# Patient Record
Sex: Female | Born: 1960 | Race: White | Hispanic: No | Marital: Married | Smoking: Former smoker
Health system: Southern US, Community
[De-identification: ages and names within clinical notes are randomized; demographics above are authoritative.]

## PROBLEM LIST (undated history)

## (undated) DIAGNOSIS — B009 Herpesviral infection, unspecified: Secondary | ICD-10-CM

## (undated) DIAGNOSIS — M7070 Other bursitis of hip, unspecified hip: Secondary | ICD-10-CM

## (undated) DIAGNOSIS — M419 Scoliosis, unspecified: Secondary | ICD-10-CM

## (undated) DIAGNOSIS — IMO0002 Reserved for concepts with insufficient information to code with codable children: Secondary | ICD-10-CM

## (undated) DIAGNOSIS — B2790 Infectious mononucleosis, unspecified without complication: Secondary | ICD-10-CM

## (undated) DIAGNOSIS — M25552 Pain in left hip: Secondary | ICD-10-CM

## (undated) DIAGNOSIS — S42309A Unspecified fracture of shaft of humerus, unspecified arm, initial encounter for closed fracture: Secondary | ICD-10-CM

## (undated) DIAGNOSIS — T7840XA Allergy, unspecified, initial encounter: Secondary | ICD-10-CM

## (undated) DIAGNOSIS — D179 Benign lipomatous neoplasm, unspecified: Secondary | ICD-10-CM

## (undated) DIAGNOSIS — D1720 Benign lipomatous neoplasm of skin and subcutaneous tissue of unspecified limb: Secondary | ICD-10-CM

## (undated) DIAGNOSIS — R87619 Unspecified abnormal cytological findings in specimens from cervix uteri: Secondary | ICD-10-CM

## (undated) DIAGNOSIS — N939 Abnormal uterine and vaginal bleeding, unspecified: Secondary | ICD-10-CM

## (undated) DIAGNOSIS — M21619 Bunion of unspecified foot: Secondary | ICD-10-CM

## (undated) DIAGNOSIS — M1990 Unspecified osteoarthritis, unspecified site: Secondary | ICD-10-CM

## (undated) DIAGNOSIS — K1379 Other lesions of oral mucosa: Secondary | ICD-10-CM

## (undated) DIAGNOSIS — F419 Anxiety disorder, unspecified: Secondary | ICD-10-CM

## (undated) DIAGNOSIS — T148XXA Other injury of unspecified body region, initial encounter: Secondary | ICD-10-CM

## (undated) DIAGNOSIS — Z8489 Family history of other specified conditions: Secondary | ICD-10-CM

## (undated) DIAGNOSIS — Z0000 Encounter for general adult medical examination without abnormal findings: Secondary | ICD-10-CM

## (undated) HISTORY — DX: Pain in left hip: M25.552

## (undated) HISTORY — DX: Abnormal uterine and vaginal bleeding, unspecified: N93.9

## (undated) HISTORY — DX: Anxiety disorder, unspecified: F41.9

## (undated) HISTORY — DX: Other bursitis of hip, unspecified hip: M70.70

## (undated) HISTORY — DX: Unspecified osteoarthritis, unspecified site: M19.90

## (undated) HISTORY — DX: Benign lipomatous neoplasm of skin and subcutaneous tissue of unspecified limb: D17.20

## (undated) HISTORY — DX: Herpesviral infection, unspecified: B00.9

## (undated) HISTORY — PX: APPENDECTOMY: SHX54

## (undated) HISTORY — DX: Unspecified fracture of shaft of humerus, unspecified arm, initial encounter for closed fracture: S42.309A

## (undated) HISTORY — DX: Unspecified abnormal cytological findings in specimens from cervix uteri: R87.619

## (undated) HISTORY — DX: Reserved for concepts with insufficient information to code with codable children: IMO0002

## (undated) HISTORY — DX: Other lesions of oral mucosa: K13.79

## (undated) HISTORY — DX: Bunion of unspecified foot: M21.619

## (undated) HISTORY — DX: Benign lipomatous neoplasm, unspecified: D17.9

## (undated) HISTORY — DX: Allergy, unspecified, initial encounter: T78.40XA

## (undated) HISTORY — DX: Encounter for general adult medical examination without abnormal findings: Z00.00

## (undated) HISTORY — DX: Infectious mononucleosis, unspecified without complication: B27.90

---

## 1994-07-13 DIAGNOSIS — R87619 Unspecified abnormal cytological findings in specimens from cervix uteri: Secondary | ICD-10-CM

## 1994-07-13 HISTORY — DX: Unspecified abnormal cytological findings in specimens from cervix uteri: R87.619

## 1994-07-13 HISTORY — PX: CERVICAL BIOPSY  W/ LOOP ELECTRODE EXCISION: SUR135

## 1997-09-24 HISTORY — PX: INTRAUTERINE DEVICE INSERTION: SHX323

## 2002-07-13 DIAGNOSIS — S42309A Unspecified fracture of shaft of humerus, unspecified arm, initial encounter for closed fracture: Secondary | ICD-10-CM

## 2002-07-13 HISTORY — DX: Unspecified fracture of shaft of humerus, unspecified arm, initial encounter for closed fracture: S42.309A

## 2003-02-04 ENCOUNTER — Emergency Department (HOSPITAL_COMMUNITY): Admission: EM | Admit: 2003-02-04 | Discharge: 2003-02-04 | Payer: Self-pay | Admitting: Emergency Medicine

## 2003-02-04 ENCOUNTER — Encounter: Payer: Self-pay | Admitting: Emergency Medicine

## 2004-06-27 ENCOUNTER — Other Ambulatory Visit: Admission: RE | Admit: 2004-06-27 | Discharge: 2004-06-27 | Payer: Self-pay | Admitting: Obstetrics and Gynecology

## 2004-07-18 ENCOUNTER — Ambulatory Visit (HOSPITAL_COMMUNITY): Admission: RE | Admit: 2004-07-18 | Discharge: 2004-07-18 | Payer: Self-pay | Admitting: Obstetrics and Gynecology

## 2005-06-29 ENCOUNTER — Other Ambulatory Visit: Admission: RE | Admit: 2005-06-29 | Discharge: 2005-06-29 | Payer: Self-pay | Admitting: Obstetrics and Gynecology

## 2005-07-21 ENCOUNTER — Ambulatory Visit (HOSPITAL_COMMUNITY): Admission: RE | Admit: 2005-07-21 | Discharge: 2005-07-21 | Payer: Self-pay | Admitting: Obstetrics and Gynecology

## 2005-07-21 ENCOUNTER — Encounter: Admission: RE | Admit: 2005-07-21 | Discharge: 2005-07-21 | Payer: Self-pay | Admitting: Obstetrics and Gynecology

## 2006-06-30 ENCOUNTER — Other Ambulatory Visit: Admission: RE | Admit: 2006-06-30 | Discharge: 2006-06-30 | Payer: Self-pay | Admitting: Obstetrics & Gynecology

## 2006-07-23 ENCOUNTER — Encounter: Admission: RE | Admit: 2006-07-23 | Discharge: 2006-07-23 | Payer: Self-pay | Admitting: Obstetrics & Gynecology

## 2007-08-23 ENCOUNTER — Other Ambulatory Visit: Admission: RE | Admit: 2007-08-23 | Discharge: 2007-08-23 | Payer: Self-pay | Admitting: Obstetrics & Gynecology

## 2007-09-09 ENCOUNTER — Encounter: Admission: RE | Admit: 2007-09-09 | Discharge: 2007-09-09 | Payer: Self-pay | Admitting: Obstetrics & Gynecology

## 2008-07-13 HISTORY — PX: IUD REMOVAL: SHX5392

## 2008-08-27 ENCOUNTER — Other Ambulatory Visit: Admission: RE | Admit: 2008-08-27 | Discharge: 2008-08-27 | Payer: Self-pay | Admitting: Obstetrics and Gynecology

## 2008-09-13 ENCOUNTER — Encounter: Admission: RE | Admit: 2008-09-13 | Discharge: 2008-09-13 | Payer: Self-pay | Admitting: Obstetrics & Gynecology

## 2009-07-13 DIAGNOSIS — M7070 Other bursitis of hip, unspecified hip: Secondary | ICD-10-CM

## 2009-07-13 HISTORY — DX: Other bursitis of hip, unspecified hip: M70.70

## 2009-07-27 ENCOUNTER — Ambulatory Visit: Payer: Self-pay | Admitting: Family Medicine

## 2009-07-27 DIAGNOSIS — IMO0002 Reserved for concepts with insufficient information to code with codable children: Secondary | ICD-10-CM | POA: Insufficient documentation

## 2009-09-16 ENCOUNTER — Encounter: Admission: RE | Admit: 2009-09-16 | Discharge: 2009-09-16 | Payer: Self-pay | Admitting: Obstetrics and Gynecology

## 2010-08-12 NOTE — Assessment & Plan Note (Signed)
Summary: L baby toe x last night rm 2   Vital Signs:  Patient Profile:   50 Years Old Female CC:      L baby toe - x last night Height:     64 inches Weight:      140 pounds O2 Sat:      100 % O2 treatment:    Room Air Temp:     98.21 degrees F oral Pulse rate:   78 / minute Pulse rhythm:   regular Resp:     16 per minute BP sitting:   112 / 71  (right arm) Cuff size:   regular  Vitals Entered By: Areta Haber CMA (July 27, 2009 3:29 PM)                  Current Allergies (reviewed today): ! PREDNISONE   History of Present Illness History from: patient Chief Complaint: L baby toe - x last night History of Present Illness: Otherwise healthy female here c/o 1 day h/o pain and sweeling localized in her left 5th toe no irradiation. First noticed pain that woke her up last night and noticed some sweeling as well. She nows remembered that few days ago her left toes felt tight in her shoe. But denies any new shoes. also denies any recent long walks or trauma in her left foot. No pain in any other joints. Pain is constant aliviated by soaking the toe on warm water. Had a pedicure about 20 days ago does not remember any trauma or cut. No taking pain medications. Normal CBG here today. No prior similar episodes.  Current Problems: OTH&UNSPEC SUPERFICIAL INJURY FOOT&TOES INFECTED (ICD-917.9) FAMILY HISTORY BREAST CANCER 1ST DEGREE RELATIVE <50 (ICD-V16.3)   Current Meds CIPROFLOXACIN HCL 500 MG TABS (CIPROFLOXACIN HCL) 1 tab by mouth two times a day for 7 days INDOMETHACIN 50 MG CAPS (INDOMETHACIN) 1 tab by mouth three times a day as needed for pain  REVIEW OF SYSTEMS Constitutional Symptoms      Denies fever, chills, night sweats, weight loss, weight gain, and fatigue.  Eyes       Denies change in vision, eye pain, eye discharge, glasses, contact lenses, and eye surgery. Ear/Nose/Throat/Mouth       Denies hearing loss/aids, change in hearing, ear pain, ear discharge,  dizziness, frequent runny nose, frequent nose bleeds, sinus problems, sore throat, hoarseness, and tooth pain or bleeding.  Respiratory       Denies dry cough, productive cough, wheezing, shortness of breath, asthma, bronchitis, and emphysema/COPD.  Cardiovascular       Denies murmurs, chest pain, and tires easily with exhertion.    Gastrointestinal       Denies stomach pain, nausea/vomiting, diarrhea, constipation, blood in bowel movements, and indigestion. Genitourniary       Denies painful urination, kidney stones, and loss of urinary control. Neurological       Denies paralysis, seizures, and fainting/blackouts. Musculoskeletal       Complains of decreased range of motion.      Denies muscle pain, joint pain, joint stiffness, redness, swelling, muscle weakness, and gout.      Comments: L Baby toe x last night Skin       Denies bruising, unusual mles/lumps or sores, and hair/skin or nail changes.  Psych       Denies mood changes, temper/anger issues, anxiety/stress, speech problems, depression, and sleep problems.  Past History:  Past Medical History: Unremarkable  Past Surgical History: Denies surgical history  Family History:  Family History Breast cancer 1st degree relative <50 - mother  Social History: Married Never Smoked Alcohol use-yes - 5 weekly Drug use-no Regular exercise-yes Smoking Status:  never Drug Use:  no Does Patient Exercise:  yes Physical Exam General appearance: well developed, well nourished, no acute distress Extremities: Left foot: No deformitites. Bearing weight. No liping. normal hind and mid foot exam. 5th toes has mild sweeling and erythema with a small darker discolaration medially and close to the medial nail border. other small dark discoloration that appears like a bruise medially at the level of interphalangeal area. Can adduct and abduct 5th toe with minimal dyscomfort also normal flexion and extension. The left 5th toes is tender in  general with palpation. No focal tenderness or fluctuations. No drainage. Assessment New Problems: OTH&UNSPEC SUPERFICIAL INJURY FOOT&TOES INFECTED (ICD-917.9) FAMILY HISTORY BREAST CANCER 1ST DEGREE RELATIVE <50 (ICD-V16.3)   Plan New Medications/Changes: INDOMETHACIN 50 MG CAPS (INDOMETHACIN) 1 tab by mouth three times a day as needed for pain  #21 x 0, 07/27/2009, Rashad Auld Moreno-Coll  MD CIPROFLOXACIN HCL 500 MG TABS (CIPROFLOXACIN HCL) 1 tab by mouth two times a day for 7 days  #14 x 0, 07/27/2009, Osamah Schmader Moreno-Coll  MD  New Orders: New Patient Level III [id number]  The patient and/or caregiver has been counseled thoroughly with regard to medications prescribed including dosage, schedule, interactions, rationale for use, and possible side effects and they verbalize understanding.  Diagnoses and expected course of recovery discussed and will return if not improved as expected or if the condition worsens. Patient and/or caregiver verbalized understanding.  Prescriptions: INDOMETHACIN 50 MG CAPS (INDOMETHACIN) 1 tab by mouth three times a day as needed for pain  #21 x 0   Entered and Authorized by:   Sharin Grave  MD   Signed by:   Sharin Grave  MD on 07/27/2009   Method used:   Print then Give to Patient   RxID:   [id number] CIPROFLOXACIN HCL 500 MG TABS (CIPROFLOXACIN HCL) 1 tab by mouth two times a day for 7 days  #14 x 0   Entered and Authorized by:   Sharin Grave  MD   Signed by:   Sharin Grave  MD on 07/27/2009   Method used:   Print then Give to Patient   RxID:   [id number]   Patient Instructions: 1)  Your blood sugar is normal here today. 2)  Clinically it looks like you might have a soft tissue infection in your left pinky toe probably iniciated by a  trauma you did not noticed. 3)  Take the antibiotic and antinflamatory medication as prescribed. can take med with food to avoid stomach upset. 4)  Return in 48 hours for follow up or earlier if  worsening pain or sweeling.

## 2010-08-21 ENCOUNTER — Other Ambulatory Visit: Payer: Self-pay | Admitting: Obstetrics & Gynecology

## 2010-08-21 DIAGNOSIS — Z1231 Encounter for screening mammogram for malignant neoplasm of breast: Secondary | ICD-10-CM

## 2010-09-18 ENCOUNTER — Ambulatory Visit: Payer: Self-pay

## 2010-09-19 ENCOUNTER — Ambulatory Visit
Admission: RE | Admit: 2010-09-19 | Discharge: 2010-09-19 | Disposition: A | Discharge status: Home / Self Care | Payer: 59 | Source: Ambulatory Visit | Attending: Obstetrics & Gynecology | Admitting: Obstetrics & Gynecology

## 2010-09-19 DIAGNOSIS — Z1231 Encounter for screening mammogram for malignant neoplasm of breast: Secondary | ICD-10-CM

## 2010-11-28 NOTE — Consult Note (Signed)
NAME:  DOT, [name]                        ACCOUNT NO.:  [id number]   MEDICAL RECORD NO.:  [id number]                   PATIENT TYPE:  EMS   LOCATION:  ED                                   FACILITY[facility]  [facility]   PHYSICIAN:  Artist Pais. Mina Marble, M.D.           DATE OF BIRTH:  [birth year]   DATE OF CONSULTATION:  02/04/2003  DATE OF DISCHARGE:                                   CONSULTATION   REASON FOR CONSULTATION:  Natalie [name] is a very pleasant 50 year old right-hand  dominant female who was working in her attic, packing boxes, and fell  through the rafters and sustained an injury to her non-dominant left radius  wrist and arm area.   ALLERGIES:  She has no known drug allergies except for PREDNISONE.   MEDICATIONS:  She is currently on no medications.   PAST MEDICAL HISTORY:  No recent hospitalizations or surgeries.   FAMILY HISTORY:  Noncontributory.   SOCIAL HISTORY:  Noncontributory.   PHYSICAL EXAMINATION:  GENERAL:  A well-developed, well-nourished female,  pleasant.  EXTREMITIES:  On examination of her left hand and wrist shows an obvious  deformity with dorsal displacement of the hand on the wrist.  She has  numbness to the inner median distribution.  Skin is intact.  No erythema,  drainage, or signs of infection.   LABORATORY DATA:  X-rays show a widely displaced extra-articular distal  radius fracture and questionable ulnar styloid fracture.   The patient was given a 2% plain lidocaine hematoma block.  Once adequate  anesthesia was obtained, she was placed in the finger trap traction with 10  pounds of traction placed across the radial carpal joint.  A closed  reduction was performed.  She was placed in a sugar-tong splint.  Post-  reduction films showed near anatomic alignment of both the AP and lateral  plane.  She was discharged from the emergency department with Percocet for  pain.  Followup in my office this Tuesday for repeat films.  I discussed  with her at  this point in time if she can maintain this alignment she should  be able to be treated conservatively, however, should her alignment slip,  she may need to be plated.  She understands the need to report to my office  immediately if she has any signs or symptoms consistent with acute carpal  tunnel syndrome, compartment syndrome, etc.  These were all explained in  detail to her.  She understands.  She was discharged again with Percocet for  pain to follow up in my office on Tuesday, February 06, 2003.                                               Artist Pais Mina Marble, M.D.    MAW/MEDQ  D:  02/04/2003  T:  02/04/2003  Job:  [id number]

## 2010-12-02 ENCOUNTER — Ambulatory Visit: Payer: 59 | Admitting: Family Medicine

## 2010-12-16 ENCOUNTER — Ambulatory Visit: Payer: 59 | Admitting: Family Medicine

## 2010-12-19 ENCOUNTER — Encounter: Payer: Self-pay | Admitting: Family Medicine

## 2010-12-19 ENCOUNTER — Ambulatory Visit (INDEPENDENT_AMBULATORY_CARE_PROVIDER_SITE_OTHER): Payer: 59 | Admitting: Family Medicine

## 2010-12-19 VITALS — BP 122/83 | HR 74 | Temp 98.0°F | Ht 64.0 in | Wt 137.8 lb

## 2010-12-19 DIAGNOSIS — M25552 Pain in left hip: Secondary | ICD-10-CM

## 2010-12-19 DIAGNOSIS — Z0000 Encounter for general adult medical examination without abnormal findings: Secondary | ICD-10-CM

## 2010-12-19 DIAGNOSIS — K5900 Constipation, unspecified: Secondary | ICD-10-CM

## 2010-12-19 DIAGNOSIS — M25559 Pain in unspecified hip: Secondary | ICD-10-CM

## 2010-12-19 DIAGNOSIS — G4700 Insomnia, unspecified: Secondary | ICD-10-CM

## 2010-12-19 MED ORDER — CLONAZEPAM 0.5 MG PO TABS: 0.5000 mg | ORAL_TABLET | Freq: Every evening | ORAL | Status: DC | PRN

## 2010-12-19 NOTE — Patient Instructions (Signed)
Hip Pain The hips join the upper legs to the lower pelvis. The bones, cartilage, tendons, and muscles of the hip joint perform a lot of work each day holding your body weight and allowing you to move around. Hip pain is a common symptom. It can range from a minor ache to severe pain on 1 or both hips. Pain may be felt on the inside of the hip joint near the groin, or the outside near the buttocks and upper thigh. There may be swelling or stiffness as well. It occurs more often when a person walks or performs activity. There are many reasons hip pain can develop. CAUSES It is important to work with your caregiver to identify the cause since many conditions can impact the bones, cartilage, muscles, and tendons of the hips. Causes for hip pain include:  Broken (fractured) bones.   Separation of the thighbone from the hip socket (dislocation).   Torn cartilage of the hip joint.   Swelling (inflammation) of a tendon (tendonitis), the sac within the hip joint (bursitis), or a joint.   A weakening in the abdominal wall (hernia), affecting the nerves to the hip.   Arthritis in the hip joint or lining of the hip joint.   Pinched nerves in the back, hip, or upper thigh.   A bulging disc in the spine (herniated disc).   Rarely, bone infection or cancer.  DIAGNOSIS The location of your hip pain will help your caregiver understand what may be causing the pain. A diagnosis is based on your medical history, your symptoms, results from your physical exam, and results from diagnostic tests. Diagnostic tests may include X-ray exams, a computerized magnetic scan (magnetic resonance imaging, MRI), or bone scan. TREATMENT Treatment will depend on the cause of your hip pain. Treatment may include:  Limiting activities and resting until symptoms improve.   Crutches or other walking supports (a cane or brace).   Ice, elevation, and compression.   Physical therapy or home exercises.   Shoe inserts or  special shoes.   Losing weight.   Medications to reduce pain.   Undergoing surgery.  HOME CARE INSTRUCTIONS  Only take over-the-counter or prescription medicines for pain, discomfort, or fever as directed by your caregiver.   Put ice on the injured area:   Put ice in a plastic bag.   Place a towel between your skin and the bag.   Leave the ice on for 15 minutes at a time, 3 times a day.   Keep your leg raised (elevated) when possible to lessen swelling.   Avoid activities that cause pain.   Follow specific exercises as directed by your caregiver.   Sleep with a pillow between your legs on your most comfortable side.   Record how often you have hip pain, the location of the pain, and what it feels like. This information may be helpful to you and your caregiver.   Ask your caregiver about returning to work or sports and whether you should drive.   Follow up with your caregiver for further exams, therapy, or testing as directed.  SEEK MEDICAL CARE IF:  Pain or swelling continues or worsens beyond 1 week.   You have an oral temperature above 104.   You are feeling unwell or have chills.   You are having an increasingly difficult time with walking.   You have a loss of sensation or other new symptoms.   You have questions or concerns.  SEEK IMMEDIATE MEDICAL CARE IF:  You cannot put weight on the affected hip.   You have fallen.   You have a sudden increase in pain and swelling in your hip.   You have an oral temperature above 104, not controlled by medicine.  MAKE SURE YOU:  Understand these instructions.   Will watch your condition.   Will get help right away if you are not doing well or get worse.  Document Released: 12/17/2009  [facility] Patient Information 2011 [place], [place].

## 2010-12-22 ENCOUNTER — Ambulatory Visit: Payer: 59 | Admitting: Family Medicine

## 2010-12-24 ENCOUNTER — Encounter: Payer: Self-pay | Admitting: Family Medicine

## 2010-12-24 DIAGNOSIS — Z0000 Encounter for general adult medical examination without abnormal findings: Secondary | ICD-10-CM | POA: Insufficient documentation

## 2010-12-24 DIAGNOSIS — K5900 Constipation, unspecified: Secondary | ICD-10-CM | POA: Insufficient documentation

## 2010-12-24 DIAGNOSIS — M25552 Pain in left hip: Secondary | ICD-10-CM

## 2010-12-24 HISTORY — DX: Pain in left hip: M25.552

## 2010-12-24 HISTORY — DX: Encounter for general adult medical examination without abnormal findings: Z00.00

## 2010-12-24 NOTE — Progress Notes (Signed)
Natalie [name] [id number] [birth year] 12/24/2010      Progress Note New Patient  Subjective  Chief Complaint  Chief Complaint  Patient presents with  . Establish Care    new patient     HPI  Patient a 50 year old Caucasian female in today for new patient appointment. She is generally very healthy but does have a few concerns. She has struggled with her weight over the years. Over the past couple of years she lost 97 pounds down from 197 to 107 but when she got that low she had health problems such as hair loss and fatigue, after she gained back roughly 30 pounds she has felt much better since. She exercises regularly. She eats well. She does note some mild difficulty with constipation. No bloody or tarry stool. Does not take any meds for this and does move her bowels every couple of days sometimes with some mild straining. No recent febrile illness/fevers/chills/HA/CP/palp/SOB/GI or GU c/o.  Past Medical History  Diagnosis Date  . Vitamin D deficiency 2-12  . Allergy     seasonal  . Bursitis of hip 1/11    left  . Hip pain, left 12/24/2010  . Constipation 12/24/2010  . Preventative health care 12/24/2010    Past Surgical History  Procedure Date  . Appendectomy 50 yrs old    Family History  Problem Relation Age of Onset  . Cancer Mother 42    breast- now bones and brain  . Cancer Father 71    prostate  . Heart disease Maternal Grandfather   . Stroke Paternal Grandfather     multiple mini strokes    History   Social History  . Marital Status: Married    Spouse Name: N/A    Number of Children: N/A  . Years of Education: N/A   Occupational History  . Not on file.   Social History Main Topics  . Smoking status: Former Smoker    Types: Cigarettes    Quit date: 07/09/2009  . Smokeless tobacco: Never Used  . Alcohol Use: Yes     5 beers weekly  . Drug Use: No  . Sexually Active: Yes -- Female partner(s)   Other Topics Concern  . Not on file   Social History  Narrative  . No narrative on file    No current outpatient prescriptions on file prior to visit.    No Known Allergies  Review of Systems  Review of Systems  Constitutional: Negative for fever, chills and malaise/fatigue.  HENT: Negative for hearing loss, ear pain, nosebleeds, congestion, sore throat and neck pain.   Eyes: Negative for discharge.  Respiratory: Negative for cough, sputum production, shortness of breath and wheezing.   Cardiovascular: Negative for chest pain, palpitations and leg swelling.  Gastrointestinal: Negative for heartburn, nausea, vomiting, abdominal pain, diarrhea, blood in stool and melena.  Genitourinary: Negative for dysuria, urgency, frequency and hematuria.  Musculoskeletal: Positive for joint pain. Negative for myalgias, back pain and falls.  Skin: Negative for rash.  Neurological: Negative for dizziness, tremors, sensory change, focal weakness, loss of consciousness, weakness and headaches.  Endo/Heme/Allergies: Negative for polydipsia. Does not bruise/bleed easily.  Psychiatric/Behavioral: Negative for depression, suicidal ideas and substance abuse. The patient is not nervous/anxious and does not have insomnia.     Objective  BP 122/83  Pulse 74  Temp(Src) 98 F (36.7 C) (Oral)  Ht 5\' 4"  (1.626 m)  Wt 137 lb 12.8 oz (62.506 kg)  BMI 23.65 kg/m2  SpO2 98%  LMP 12/13/2010  Physical Exam  Physical Exam  Constitutional: She is oriented to person, place, and time and well-developed, well-nourished, and in no distress. No distress.  HENT:  Head: Normocephalic and atraumatic.  Right Ear: External ear normal.  Left Ear: External ear normal.  Nose: Nose normal.  Mouth/Throat: Oropharynx is clear and moist. No oropharyngeal exudate.  Eyes: Conjunctivae are normal. Pupils are equal, round, and reactive to light. Right eye exhibits no discharge. Left eye exhibits no discharge. No scleral icterus.  Neck: Normal range of motion. Neck supple. No  thyromegaly present.  Cardiovascular: Normal rate, regular rhythm, normal heart sounds and intact distal pulses.   No murmur heard. Pulmonary/Chest: Effort normal and breath sounds normal. No respiratory distress. She has no wheezes. She has no rales.  Abdominal: Soft. Bowel sounds are normal. She exhibits no distension and no mass. There is no tenderness.  Musculoskeletal: Normal range of motion. She exhibits no edema and no tenderness.  Lymphadenopathy:    She has no cervical adenopathy.  Neurological: She is alert and oriented to person, place, and time. She has normal reflexes. No cranial nerve deficit. Coordination normal.  Skin: Skin is warm and dry. No rash noted. She is not diaphoretic.  Psychiatric: Mood, memory and affect normal.       Assessment & Plan  Preventative health care Patient agrees to annual labs and she will continue with her gyn exams every 1-2 years. No ongoing concerns today. She exercises regularly and eats well. She has lost 90 pounds over the past couple of years but then began to have new health concerns so she gained back 30 pounds and she has done well since then.   Hip pain, left She notes she has frequent pain in her hip especially with increased exercise. She may use NSAIDs and consider physical therapy if pain worsens.  Constipation Longstanding mild constipation, encouraged a fiber supplement, increased fluids and a probiotic

## 2010-12-24 NOTE — Assessment & Plan Note (Signed)
Longstanding mild constipation, encouraged a fiber supplement, increased fluids and a probiotic

## 2010-12-24 NOTE — Assessment & Plan Note (Signed)
Patient agrees to annual labs and she will continue with her gyn exams every 1-2 years. No ongoing concerns today. She exercises regularly and eats well. She has lost 90 pounds over the past couple of years but then began to have new health concerns so she gained back 30 pounds and she has done well since then.

## 2010-12-24 NOTE — Assessment & Plan Note (Signed)
She notes she has frequent pain in her hip especially with increased exercise. She may use NSAIDs and consider physical therapy if pain worsens.

## 2011-02-16 ENCOUNTER — Telehealth: Payer: Self-pay | Admitting: Family Medicine

## 2011-02-16 NOTE — Telephone Encounter (Signed)
Patient needs lab values for a health assessment she is doing

## 2011-02-16 NOTE — Telephone Encounter (Signed)
Pt informed

## 2011-06-15 ENCOUNTER — Other Ambulatory Visit: Payer: 59

## 2011-06-16 ENCOUNTER — Telehealth: Payer: Self-pay | Admitting: Family Medicine

## 2011-06-16 NOTE — Telephone Encounter (Signed)
OK to do HSV  Labs, Wilkie Aye knows I like the blood test that checks HSV yes or no and then reflexes to type 1 or 2

## 2011-06-16 NOTE — Telephone Encounter (Signed)
Please advise 

## 2011-06-16 NOTE — Telephone Encounter (Signed)
Pt will come in January for labwork and office visit.  New benefit year starts at that time.

## 2011-06-16 NOTE — Telephone Encounter (Signed)
Patient has sores in mouth & throat after having an orthodontis appt. She went to the [facility]  & they said she does not have strep & that they don't know what the sores are from but that they suspect they are Herpes Simplex 1 or possibly a latex reaction. They gave her prednisone & chloroxidine mouthwash. She is going to come in for her bloodwork tomorrow morning, would like to also be tested for Herpes Simplex 1

## 2011-06-17 ENCOUNTER — Other Ambulatory Visit: Payer: 59

## 2011-06-22 ENCOUNTER — Ambulatory Visit: Payer: 59 | Admitting: Family Medicine

## 2011-07-16 ENCOUNTER — Other Ambulatory Visit: Payer: Self-pay | Admitting: Family Medicine

## 2011-07-16 ENCOUNTER — Other Ambulatory Visit (INDEPENDENT_AMBULATORY_CARE_PROVIDER_SITE_OTHER): Payer: 59

## 2011-07-16 DIAGNOSIS — Z0000 Encounter for general adult medical examination without abnormal findings: Secondary | ICD-10-CM

## 2011-07-16 LAB — HEPATIC FUNCTION PANEL
ALT: 13 U/L (ref 0–35)
AST: 17 U/L (ref 0–37)
Alkaline Phosphatase: 48 U/L (ref 39–117)
Bilirubin, Direct: 0 mg/dL (ref 0.0–0.3)
Total Bilirubin: 0.6 mg/dL (ref 0.3–1.2)
Total Protein: 6.4 g/dL (ref 6.0–8.3)

## 2011-07-16 LAB — CBC
MCV: 93.1 fl (ref 78.0–100.0)
Platelets: 290 10*3/uL (ref 150.0–400.0)
RBC: 4.16 Mil/uL (ref 3.87–5.11)
WBC: 4 10*3/uL — ABNORMAL LOW (ref 4.5–10.5)

## 2011-07-16 LAB — LIPID PANEL
LDL Cholesterol: 103 mg/dL — ABNORMAL HIGH (ref 0–99)
Total CHOL/HDL Ratio: 3

## 2011-07-16 LAB — RENAL FUNCTION PANEL
Albumin: 4.2 g/dL (ref 3.5–5.2)
Chloride: 106 mEq/L (ref 96–112)
GFR: 100.87 mL/min (ref >60.00)
Phosphorus: 3.5 mg/dL (ref 2.3–4.6)
Potassium: 4.2 mEq/L (ref 3.5–5.1)
Sodium: 139 mEq/L (ref 135–145)

## 2011-07-20 LAB — HSV(HERPES SIMPLEX VRS) I + II AB-IGG: HSV 1 Glycoprotein G Ab, IgG: 9.7 IV — ABNORMAL HIGH

## 2011-07-22 ENCOUNTER — Ambulatory Visit (INDEPENDENT_AMBULATORY_CARE_PROVIDER_SITE_OTHER): Payer: 59 | Admitting: Family Medicine

## 2011-07-22 ENCOUNTER — Encounter: Payer: Self-pay | Admitting: Family Medicine

## 2011-07-22 DIAGNOSIS — M25552 Pain in left hip: Secondary | ICD-10-CM

## 2011-07-22 DIAGNOSIS — M25559 Pain in unspecified hip: Secondary | ICD-10-CM

## 2011-07-22 DIAGNOSIS — K1370 Unspecified lesions of oral mucosa: Secondary | ICD-10-CM

## 2011-07-22 DIAGNOSIS — Z0000 Encounter for general adult medical examination without abnormal findings: Secondary | ICD-10-CM

## 2011-07-22 DIAGNOSIS — K1379 Other lesions of oral mucosa: Secondary | ICD-10-CM

## 2011-07-22 HISTORY — DX: Other lesions of oral mucosa: K13.79

## 2011-07-22 NOTE — Patient Instructions (Signed)
Bursitis Bursitis is a swelling and soreness (inflammation) of a fluid-filled sac (bursa) that overlies and protects a joint. It can be caused by injury, overuse of the joint, arthritis or infection. The joints most likely to be affected are the elbows, shoulders, hips and knees. HOME CARE INSTRUCTIONS   Apply ice to the affected area for 15 to 20 minutes each hour while awake for 2 days. Put the ice in a plastic bag and place a towel between the bag of ice and your skin.   Rest the injured joint as much as possible, but continue to put the joint through a full range of motion, 4 times per day. (The shoulder joint especially becomes rapidly "frozen" if not used.) When the pain lessens, begin normal slow movements and usual activities.   Only take over-the-counter or prescription medicines for pain, discomfort or fever as directed by your caregiver.   Your caregiver may recommend draining the bursa and injecting medicine into the bursa. This may help the healing process.   Follow all instructions for follow-up with your caregiver. This includes any orthopedic referrals, physical therapy and rehabilitation. Any delay in obtaining necessary care could result in a delay or failure of the bursitis to heal and chronic pain.  SEEK IMMEDIATE MEDICAL CARE IF:   Your pain increases even during treatment.   You develop an oral temperature above 102 F (38.9 C) and have heat and inflammation over the involved bursa.  MAKE SURE YOU:   Understand these instructions.   Will watch your condition.   Will get help right away if you are not doing well or get worse.   Start MegaRed 1 cap daily and let us know if Physical therapy needs to be referred to a different PT Document Released: 06/26/2000 Document Revised: 03/11/2011 Document Reviewed: 05/31/2009 [facility] Patient Information 2012 [place], [place].

## 2011-07-22 NOTE — Assessment & Plan Note (Signed)
Patient has been struggling with recurrent painful lesions has been worse after each othodontic appt she has had for the past 6 months. Did have a recent course of Acyclovir and magic mouthwash she got from urgent care.

## 2011-07-23 ENCOUNTER — Telehealth: Payer: Self-pay | Admitting: Family Medicine

## 2011-07-23 NOTE — Telephone Encounter (Signed)
Pls contact patient at [phone]. She has 2 medications that are on her medication list that need to be removed, she thinks the pharmacy listed Troy's medications as hers.

## 2011-07-23 NOTE — Telephone Encounter (Signed)
Pt states that the Flexeril and Hydrocodone are supposed to be in her husbands chart? I removed medication and I will add these to [facility] chart.

## 2011-07-26 NOTE — Progress Notes (Signed)
Patient ID: Natalie [name], female   DOB: 1960-07-16, 51 y.o.   MRN: [id number] Natalie [name] [id number] 01-27-1961 07/26/2011      Progress Note-Follow Up  Subjective  Chief Complaint  Chief Complaint  Patient presents with  . Follow-up    3 month follow up    HPI  Patient is a 51 year old Caucasian female in today for followup. He is struggling with recurrent oral ulcers and sores in her throat each time his dental work done. She's had them take the metal out of her mouth but does believe that's helped some. 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 No falls or injury.  Past Medical History  Diagnosis Date  . Vitamin D deficiency 2-12  . Allergy     seasonal  . Bursitis of hip 1/11    left  . Hip pain, left 12/24/2010  . Constipation 12/24/2010  . Preventative health care 12/24/2010  . Recurrent oral ulcers 07/22/2011    Past Surgical History  Procedure Date  . Appendectomy 51 yrs old    Family History  Problem Relation Age of Onset  . Cancer Mother 74    breast- now bones and brain  . Cancer Father 40    prostate  . Heart disease Maternal Grandfather   . Stroke Paternal Grandfather     multiple mini strokes    History   Social History  . Marital Status: Married    Spouse Name: N/A    Number of Children: N/A  . Years of Education: N/A   Occupational History  . Not on file.   Social History Main Topics  . Smoking status: Former Smoker    Types: Cigarettes    Quit date: 07/09/2009  . Smokeless tobacco: Never Used  . Alcohol Use: Yes     5 beers weekly  . Drug Use: No  . Sexually Active: Yes -- Female partner(s)   Other Topics Concern  . Not on file   Social History Narrative  . No narrative on file    Current Outpatient Prescriptions on File Prior to Visit    Medication Sig Dispense Refill  . clonazePAM (KLONOPIN) 0.5 MG tablet Take 1 tablet (0.5 mg total) by mouth at bedtime as needed for anxiety (insomnia/anxiety).  10 tablet  0  . ergocalciferol (VITAMIN D2) 50000 UNITS capsule Take 50,000 Units by mouth. Every other week         No Known Allergies  Review of Systems  Review of Systems  Constitutional: Negative for fever and malaise/fatigue.  HENT: Positive for sore throat. Negative for congestion.   Eyes: Negative for discharge.  Respiratory: Negative for shortness of breath.   Cardiovascular: Negative for chest pain, palpitations and leg swelling.  Gastrointestinal: Negative for nausea, abdominal pain and diarrhea.  Genitourinary: Negative for dysuria.  Musculoskeletal: Negative for falls.  Skin: Negative for rash.  Neurological: Negative for loss of consciousness and headaches.  Endo/Heme/Allergies: Negative for polydipsia.  Psychiatric/Behavioral: Negative for depression and suicidal ideas. The patient is not nervous/anxious and does not have insomnia.     Objective  BP 132/78  Pulse 76  Temp(Src) 98.2 F (36.8 C) (Temporal)  Ht 5\' 4"  (1.626 m)  Wt 132 lb 12.8 oz (60.238 kg)  BMI 22.80 kg/m2  SpO2 100%  LMP 06/10/2011  Physical Exam  Physical  Exam  Constitutional: She is oriented to person, place, and time and well-developed, well-nourished, and in no distress. No distress.  HENT:  Head: Normocephalic and atraumatic.  Eyes: Conjunctivae are normal.  Neck: Neck supple. No thyromegaly present.  Cardiovascular: Normal rate, regular rhythm and normal heart sounds.   No murmur heard. Pulmonary/Chest: Effort normal and breath sounds normal. She has no wheezes.  Abdominal: She exhibits no distension and no mass.  Musculoskeletal: She exhibits no edema.  Lymphadenopathy:    She has no cervical adenopathy.  Neurological: She is alert and oriented to person, place, and time.  Skin: Skin is warm and dry. No rash noted.  She is not diaphoretic.  Psychiatric: Memory, affect and judgment normal.    Lab Results  Component Value Date   TSH 1.69 07/16/2011   Lab Results  Component Value Date   WBC 4.0* 07/16/2011   HGB 13.1 07/16/2011   HCT 38.7 07/16/2011   MCV 93.1 07/16/2011   PLT 290.0 07/16/2011   Lab Results  Component Value Date   CREATININE 0.7 07/16/2011   BUN 11 07/16/2011   NA 139 07/16/2011   K 4.2 07/16/2011   CL 106 07/16/2011   CO2 29 07/16/2011   Lab Results  Component Value Date   ALT 13 07/16/2011   AST 17 07/16/2011   ALKPHOS 48 07/16/2011   BILITOT 0.6 07/16/2011   Lab Results  Component Value Date   CHOL 187 07/16/2011   Lab Results  Component Value Date   HDL 73.30 07/16/2011   Lab Results  Component Value Date   LDLCALC 103* 07/16/2011   Lab Results  Component Value Date   TRIG 55.0 07/16/2011   Lab Results  Component Value Date   CHOLHDL 3 07/16/2011     Assessment & Plan  Recurrent oral ulcers Patient has been struggling with recurrent painful lesions has been worse after each othodontic appt she has had for the past 6 months. Did have a recent course of Acyclovir and magic mouthwash she got from urgent care.   Hip pain, left Intermittent, enocuraged increase Omega Fatty acid supplements, avoid activities that aggravate it and follow up with ortho if persists.  Preventative health care Labs reviewed with patient today, no concerning findings.

## 2011-07-26 NOTE — Assessment & Plan Note (Signed)
Intermittent, enocuraged increase Omega Fatty acid supplements, avoid activities that aggravate it and follow up with ortho if persists.

## 2011-07-26 NOTE — Assessment & Plan Note (Signed)
Labs reviewed with patient today, no concerning findings.

## 2011-08-12 ENCOUNTER — Other Ambulatory Visit: Payer: Self-pay | Admitting: Certified Nurse Midwife

## 2011-08-12 DIAGNOSIS — Z1231 Encounter for screening mammogram for malignant neoplasm of breast: Secondary | ICD-10-CM

## 2011-09-24 ENCOUNTER — Ambulatory Visit (HOSPITAL_BASED_OUTPATIENT_CLINIC_OR_DEPARTMENT_OTHER)
Admission: RE | Admit: 2011-09-24 | Discharge: 2011-09-24 | Disposition: A | Discharge status: Home / Self Care | Payer: 59 | Source: Ambulatory Visit | Attending: Certified Nurse Midwife | Admitting: Certified Nurse Midwife

## 2011-09-24 DIAGNOSIS — Z1231 Encounter for screening mammogram for malignant neoplasm of breast: Secondary | ICD-10-CM | POA: Insufficient documentation

## 2011-09-29 ENCOUNTER — Other Ambulatory Visit: Payer: Self-pay | Admitting: Certified Nurse Midwife

## 2011-09-29 DIAGNOSIS — R928 Other abnormal and inconclusive findings on diagnostic imaging of breast: Secondary | ICD-10-CM

## 2011-10-05 ENCOUNTER — Ambulatory Visit
Admission: RE | Admit: 2011-10-05 | Discharge: 2011-10-05 | Disposition: A | Discharge status: Home / Self Care | Payer: 59 | Source: Ambulatory Visit | Attending: Certified Nurse Midwife | Admitting: Certified Nurse Midwife

## 2011-10-05 DIAGNOSIS — R928 Other abnormal and inconclusive findings on diagnostic imaging of breast: Secondary | ICD-10-CM

## 2011-10-12 ENCOUNTER — Other Ambulatory Visit: Payer: Self-pay | Admitting: Obstetrics & Gynecology

## 2011-10-12 DIAGNOSIS — R928 Other abnormal and inconclusive findings on diagnostic imaging of breast: Secondary | ICD-10-CM

## 2011-11-27 ENCOUNTER — Ambulatory Visit: Payer: 59 | Admitting: Family Medicine

## 2011-12-04 ENCOUNTER — Ambulatory Visit: Payer: 59 | Admitting: Family Medicine

## 2012-03-24 ENCOUNTER — Ambulatory Visit
Admission: RE | Admit: 2012-03-24 | Discharge: 2012-03-24 | Disposition: A | Discharge status: Home / Self Care | Payer: 59 | Source: Ambulatory Visit | Attending: Obstetrics & Gynecology | Admitting: Obstetrics & Gynecology

## 2012-03-24 ENCOUNTER — Other Ambulatory Visit: Payer: Self-pay | Admitting: Obstetrics & Gynecology

## 2012-03-24 DIAGNOSIS — Z1231 Encounter for screening mammogram for malignant neoplasm of breast: Secondary | ICD-10-CM

## 2012-03-24 DIAGNOSIS — R928 Other abnormal and inconclusive findings on diagnostic imaging of breast: Secondary | ICD-10-CM

## 2012-07-14 ENCOUNTER — Other Ambulatory Visit: Payer: 59

## 2012-07-15 ENCOUNTER — Other Ambulatory Visit (INDEPENDENT_AMBULATORY_CARE_PROVIDER_SITE_OTHER): Payer: 59

## 2012-07-15 DIAGNOSIS — R109 Unspecified abdominal pain: Secondary | ICD-10-CM

## 2012-07-15 DIAGNOSIS — Z0000 Encounter for general adult medical examination without abnormal findings: Secondary | ICD-10-CM

## 2012-07-15 LAB — HEPATIC FUNCTION PANEL
ALT: 17 U/L (ref 0–35)
AST: 16 U/L (ref 0–37)
Albumin: 3.9 g/dL (ref 3.5–5.2)

## 2012-07-15 LAB — RENAL FUNCTION PANEL
CO2: 27 mEq/L (ref 19–32)
Creatinine, Ser: 0.6 mg/dL (ref 0.4–1.2)
GFR: 121.44 mL/min (ref >60.00)
Glucose, Bld: 87 mg/dL (ref 70–99)
Sodium: 137 mEq/L (ref 135–145)

## 2012-07-15 LAB — LIPID PANEL: Cholesterol: 177 mg/dL (ref 0–200)

## 2012-07-15 LAB — CBC
HCT: 37.6 % (ref 36.0–46.0)
Hemoglobin: 12.9 g/dL (ref 12.0–15.0)
RBC: 4.07 Mil/uL (ref 3.87–5.11)
WBC: 3.6 10*3/uL — ABNORMAL LOW (ref 4.5–10.5)

## 2012-07-18 LAB — HEPATITIS PANEL, ACUTE: HCV Ab: NEGATIVE

## 2012-07-19 NOTE — Progress Notes (Signed)
Quick Note:  Patient Informed and voiced understanding ______ 

## 2012-07-29 ENCOUNTER — Encounter: Payer: Self-pay | Admitting: Family Medicine

## 2012-07-29 ENCOUNTER — Ambulatory Visit (INDEPENDENT_AMBULATORY_CARE_PROVIDER_SITE_OTHER): Payer: 59 | Admitting: Family Medicine

## 2012-07-29 VITALS — BP 126/79 | HR 76 | Temp 98.9°F | Ht 64.0 in | Wt 138.8 lb

## 2012-07-29 DIAGNOSIS — M25559 Pain in unspecified hip: Secondary | ICD-10-CM

## 2012-07-29 DIAGNOSIS — K1370 Unspecified lesions of oral mucosa: Secondary | ICD-10-CM

## 2012-07-29 DIAGNOSIS — Z1211 Encounter for screening for malignant neoplasm of colon: Secondary | ICD-10-CM

## 2012-07-29 DIAGNOSIS — M25552 Pain in left hip: Secondary | ICD-10-CM

## 2012-07-29 DIAGNOSIS — K5900 Constipation, unspecified: Secondary | ICD-10-CM

## 2012-07-29 DIAGNOSIS — Z0000 Encounter for general adult medical examination without abnormal findings: Secondary | ICD-10-CM

## 2012-07-29 DIAGNOSIS — K1379 Other lesions of oral mucosa: Secondary | ICD-10-CM

## 2012-07-29 NOTE — Patient Instructions (Addendum)
Try Biotin and Flaxseed grind it daily Preventive Care for Adults, Female A healthy lifestyle and preventive care can promote health and wellness. Preventive health guidelines for women include the following key practices.  A routine yearly physical is a good way to check with your caregiver about your health and preventive screening. It is a chance to share any concerns and updates on your health, and to receive a thorough exam.  Visit your dentist for a routine exam and preventive care every 6 months. Brush your teeth twice a day and floss once a day. Good oral hygiene prevents tooth decay and gum disease.  The frequency of eye exams is based on your age, health, family medical history, use of contact lenses, and other factors. Follow your caregiver's recommendations for frequency of eye exams.  Eat a healthy diet. Foods like vegetables, fruits, whole grains, low-fat dairy products, and lean protein foods contain the nutrients you need without too many calories. Decrease your intake of foods high in solid fats, added sugars, and salt. Eat the right amount of calories for you.Get information about a proper diet from your caregiver, if necessary.  Regular physical exercise is one of the most important things you can do for your health. Most adults should get at least 150 minutes of moderate-intensity exercise (any activity that increases your heart rate and causes you to sweat) each week. In addition, most adults need muscle-strengthening exercises on 2 or more days a week.  Maintain a healthy weight. The body mass index (BMI) is a screening tool to identify possible weight problems. It provides an estimate of body fat based on height and weight. Your caregiver can help determine your BMI, and can help you achieve or maintain a healthy weight.For adults 20 years and older:  A BMI below 18.5 is considered underweight.  A BMI of 18.5 to 24.9 is normal.  A BMI of 25 to 29.9 is considered  overweight.  A BMI of 30 and above is considered obese.  Maintain normal blood lipids and cholesterol levels by exercising and minimizing your intake of saturated fat. Eat a balanced diet with plenty of fruit and vegetables. Blood tests for lipids and cholesterol should begin at age 35 and be repeated every 5 years. If your lipid or cholesterol levels are high, you are over 50, or you are at high risk for heart disease, you may need your cholesterol levels checked more frequently.Ongoing high lipid and cholesterol levels should be treated with medicines if diet and exercise are not effective.  If you smoke, find out from your caregiver how to quit. If you do not use tobacco, do not start.  If you are pregnant, do not drink alcohol. If you are breastfeeding, be very cautious about drinking alcohol. If you are not pregnant and choose to drink alcohol, do not exceed 1 drink per day. One drink is considered to be 12 ounces (355 mL) of beer, 5 ounces (148 mL) of wine, or 1.5 ounces (44 mL) of liquor.  Avoid use of street drugs. Do not share needles with anyone. Ask for help if you need support or instructions about stopping the use of drugs.  High blood pressure causes heart disease and increases the risk of stroke. Your blood pressure should be checked at least every 1 to 2 years. Ongoing high blood pressure should be treated with medicines if weight loss and exercise are not effective.  If you are 11 to 51 years old, ask your caregiver if you should  take aspirin to prevent strokes.  Diabetes screening involves taking a blood sample to check your fasting blood sugar level. This should be done once every 3 years, after age 8, if you are within normal weight and without risk factors for diabetes. Testing should be considered at a younger age or be carried out more frequently if you are overweight and have at least 1 risk factor for diabetes.  Breast cancer screening is essential preventive care for  women. You should practice "breast self-awareness." This means understanding the normal appearance and feel of your breasts and may include breast self-examination. Any changes detected, no matter how small, should be reported to a caregiver. Women in their 35s and 30s should have a clinical breast exam (CBE) by a caregiver as part of a regular health exam every 1 to 3 years. After age 29, women should have a CBE every year. Starting at age 40, women should consider having a mammography (breast X-ray test) every year. Women who have a family history of breast cancer should talk to their caregiver about genetic screening. Women at a high risk of breast cancer should talk to their caregivers about having magnetic resonance imaging (MRI) and a mammography every year.  The Pap test is a screening test for cervical cancer. A Pap test can show cell changes on the cervix that might become cervical cancer if left untreated. A Pap test is a procedure in which cells are obtained and examined from the lower end of the uterus (cervix).  Women should have a Pap test starting at age 67.  Between ages 51 and 5, Pap tests should be repeated every 2 years.  Beginning at age 67, you should have a Pap test every 3 years as long as the past 3 Pap tests have been normal.  Some women have medical problems that increase the chance of getting cervical cancer. Talk to your caregiver about these problems. It is especially important to talk to your caregiver if a new problem develops soon after your last Pap test. In these cases, your caregiver may recommend more frequent screening and Pap tests.  The above recommendations are the same for women who have or have not gotten the vaccine for human papillomavirus (HPV).  If you had a hysterectomy for a problem that was not cancer or a condition that could lead to cancer, then you no longer need Pap tests. Even if you no longer need a Pap test, a regular exam is a good idea to make  sure no other problems are starting.  If you are between ages 57 and 48, and you have had normal Pap tests going back 10 years, you no longer need Pap tests. Even if you no longer need a Pap test, a regular exam is a good idea to make sure no other problems are starting.  If you have had past treatment for cervical cancer or a condition that could lead to cancer, you need Pap tests and screening for cancer for at least 20 years after your treatment.  If Pap tests have been discontinued, risk factors (such as a new sexual partner) need to be reassessed to determine if screening should be resumed.  The HPV test is an additional test that may be used for cervical cancer screening. The HPV test looks for the virus that can cause the cell changes on the cervix. The cells collected during the Pap test can be tested for HPV. The HPV test could be used to screen women  aged 30 years and older, and should be used in women of any age who have unclear Pap test results. After the age of 57, women should have HPV testing at the same frequency as a Pap test.  Colorectal cancer can be detected and often prevented. Most routine colorectal cancer screening begins at the age of 9 and continues through age 61. However, your caregiver may recommend screening at an earlier age if you have risk factors for colon cancer. On a yearly basis, your caregiver may provide home test kits to check for hidden blood in the stool. Use of a small camera at the end of a tube, to directly examine the colon (sigmoidoscopy or colonoscopy), can detect the earliest forms of colorectal cancer. Talk to your caregiver about this at age 82, when routine screening begins. Direct examination of the colon should be repeated every 5 to 10 years through age 35, unless early forms of pre-cancerous polyps or small growths are found.  Hepatitis C blood testing is recommended for all people born from [birth year] through 1965 and any individual with known risks  for hepatitis C.  Practice safe sex. Use condoms and avoid high-risk sexual practices to reduce the spread of sexually transmitted infections (STIs). STIs include gonorrhea, chlamydia, syphilis, trichomonas, herpes, HPV, and human immunodeficiency virus (HIV). Herpes, HIV, and HPV are viral illnesses that have no cure. They can result in disability, cancer, and death. Sexually active women aged 104 and younger should be checked for chlamydia. Older women with new or multiple partners should also be tested for chlamydia. Testing for other STIs is recommended if you are sexually active and at increased risk.  Osteoporosis is a disease in which the bones lose minerals and strength with aging. This can result in serious bone fractures. The risk of osteoporosis can be identified using a bone density scan. Women ages 60 and over and women at risk for fractures or osteoporosis should discuss screening with their caregivers. Ask your caregiver whether you should take a calcium supplement or vitamin D to reduce the rate of osteoporosis.  Menopause can be associated with physical symptoms and risks. Hormone replacement therapy is available to decrease symptoms and risks. You should talk to your caregiver about whether hormone replacement therapy is right for you.  Use sunscreen with sun protection factor (SPF) of 30 or more. Apply sunscreen liberally and repeatedly throughout the day. You should seek shade when your shadow is shorter than you. Protect yourself by wearing long sleeves, pants, a wide-brimmed hat, and sunglasses year round, whenever you are outdoors.  Once a month, do a whole body skin exam, using a mirror to look at the skin on your back. Notify your caregiver of new moles, moles that have irregular borders, moles that are larger than a pencil eraser, or moles that have changed in shape or color.  Stay current with required immunizations.  Influenza. You need a dose every fall (or winter). The  composition of the flu vaccine changes each year, so being vaccinated once is not enough.  Pneumococcal polysaccharide. You need 1 to 2 doses if you smoke cigarettes or if you have certain chronic medical conditions. You need 1 dose at age 73 (or older) if you have never been vaccinated.  Tetanus, diphtheria, pertussis (Tdap, Td). Get 1 dose of Tdap vaccine if you are younger than age 51, are over 13 and have contact with an infant, are a Research scientist (physical sciences), are pregnant, or simply want to be protected from whooping  cough. After that, you need a Td booster dose every 10 years. Consult your caregiver if you have not had at least 3 tetanus and diphtheria-containing shots sometime in your life or have a deep or dirty wound.  HPV. You need this vaccine if you are a woman age 60 or younger. The vaccine is given in 3 doses over 6 months.  Measles, mumps, rubella (MMR). You need at least 1 dose of MMR if you were born in 1957 or later. You may also need a second dose.  Meningococcal. If you are age 41 to 4 and a first-year college student living in a residence hall, or have one of several medical conditions, you need to get vaccinated against meningococcal disease. You may also need additional booster doses.  Zoster (shingles). If you are age 81 or older, you should get this vaccine.  Varicella (chickenpox). If you have never had chickenpox or you were vaccinated but received only 1 dose, talk to your caregiver to find out if you need this vaccine.  Hepatitis A. You need this vaccine if you have a specific risk factor for hepatitis A virus infection or you simply wish to be protected from this disease. The vaccine is usually given as 2 doses, 6 to 18 months apart.  Hepatitis B. You need this vaccine if you have a specific risk factor for hepatitis B virus infection or you simply wish to be protected from this disease. The vaccine is given in 3 doses, usually over 6 months. Preventive Services /  Frequency Ages 16 to 63  Blood pressure check.** / Every 1 to 2 years.  Lipid and cholesterol check.** / Every 5 years beginning at age 35.  Clinical breast exam.** / Every 3 years for women in their 66s and 30s.  Pap test.** / Every 2 years from ages 41 through 63. Every 3 years starting at age 63 through age 77 or 90 with a history of 3 consecutive normal Pap tests.  HPV screening.** / Every 3 years from ages 66 through ages 28 to 41 with a history of 3 consecutive normal Pap tests.  Hepatitis C blood test.** / For any individual with known risks for hepatitis C.  Skin self-exam. / Monthly.  Influenza immunization.** / Every year.  Pneumococcal polysaccharide immunization.** / 1 to 2 doses if you smoke cigarettes or if you have certain chronic medical conditions.  Tetanus, diphtheria, pertussis (Tdap, Td) immunization. / A one-time dose of Tdap vaccine. After that, you need a Td booster dose every 10 years.  HPV immunization. / 3 doses over 6 months, if you are 69 and younger.  Measles, mumps, rubella (MMR) immunization. / You need at least 1 dose of MMR if you were born in 1957 or later. You may also need a second dose.  Meningococcal immunization. / 1 dose if you are age 88 to 60 and a first-year college student living in a residence hall, or have one of several medical conditions, you need to get vaccinated against meningococcal disease. You may also need additional booster doses.  Varicella immunization.** / Consult your caregiver.  Hepatitis A immunization.** / Consult your caregiver. 2 doses, 6 to 18 months apart.  Hepatitis B immunization.** / Consult your caregiver. 3 doses usually over 6 months. Ages 94 to 44  Blood pressure check.** / Every 1 to 2 years.  Lipid and cholesterol check.** / Every 5 years beginning at age 78.  Clinical breast exam.** / Every year after age 73.  Mammogram.** /  Every year beginning at age 66 and continuing for as long as you are in  good health. Consult with your caregiver.  Pap test.** / Every 3 years starting at age 30 through age 2 or 19 with a history of 3 consecutive normal Pap tests.  HPV screening.** / Every 3 years from ages 32 through ages 74 to 92 with a history of 3 consecutive normal Pap tests.  Fecal occult blood test (FOBT) of stool. / Every year beginning at age 23 and continuing until age 62. You may not need to do this test if you get a colonoscopy every 10 years.  Flexible sigmoidoscopy or colonoscopy.** / Every 5 years for a flexible sigmoidoscopy or every 10 years for a colonoscopy beginning at age 48 and continuing until age 12.  Hepatitis C blood test.** / For all people born from [birth year] through 1964 and any individual with known risks for hepatitis C.  Skin self-exam. / Monthly.  Influenza immunization.** / Every year.  Pneumococcal polysaccharide immunization.** / 1 to 2 doses if you smoke cigarettes or if you have certain chronic medical conditions.  Tetanus, diphtheria, pertussis (Tdap, Td) immunization.** / A one-time dose of Tdap vaccine. After that, you need a Td booster dose every 10 years.  Measles, mumps, rubella (MMR) immunization. / You need at least 1 dose of MMR if you were born in 1957 or later. You may also need a second dose.  Varicella immunization.** / Consult your caregiver.  Meningococcal immunization.** / Consult your caregiver.  Hepatitis A immunization.** / Consult your caregiver. 2 doses, 6 to 18 months apart.  Hepatitis B immunization.** / Consult your caregiver. 3 doses, usually over 6 months. Ages 66 and over  Blood pressure check.** / Every 1 to 2 years.  Lipid and cholesterol check.** / Every 5 years beginning at age 27.  Clinical breast exam.** / Every year after age 59.  Mammogram.** / Every year beginning at age 78 and continuing for as long as you are in good health. Consult with your caregiver.  Pap test.** / Every 3 years starting at age 77 through  age 22 or 47 with a 3 consecutive normal Pap tests. Testing can be stopped between 65 and 70 with 3 consecutive normal Pap tests and no abnormal Pap or HPV tests in the past 10 years.  HPV screening.** / Every 3 years from ages 34 through ages 3 or 63 with a history of 3 consecutive normal Pap tests. Testing can be stopped between 65 and 70 with 3 consecutive normal Pap tests and no abnormal Pap or HPV tests in the past 10 years.  Fecal occult blood test (FOBT) of stool. / Every year beginning at age 51 and continuing until age 63. You may not need to do this test if you get a colonoscopy every 10 years.  Flexible sigmoidoscopy or colonoscopy.** / Every 5 years for a flexible sigmoidoscopy or every 10 years for a colonoscopy beginning at age 73 and continuing until age 64.  Hepatitis C blood test.** / For all people born from [birth year] through 1965 and any individual with known risks for hepatitis C.  Osteoporosis screening.** / A one-time screening for women ages 60 and over and women at risk for fractures or osteoporosis.  Skin self-exam. / Monthly.  Influenza immunization.** / Every year.  Pneumococcal polysaccharide immunization.** / 1 dose at age 34 (or older) if you have never been vaccinated.  Tetanus, diphtheria, pertussis (Tdap, Td) immunization. / A one-time dose of  Tdap vaccine if you are over 65 and have contact with an infant, are a Research scientist (physical sciences), or simply want to be protected from whooping cough. After that, you need a Td booster dose every 10 years.  Varicella immunization.** / Consult your caregiver.  Meningococcal immunization.** / Consult your caregiver.  Hepatitis A immunization.** / Consult your caregiver. 2 doses, 6 to 18 months apart.  Hepatitis B immunization.** / Check with your caregiver. 3 doses, usually over 6 months. ** Family history and personal history of risk and conditions may change your caregiver's recommendations. Document Released: 08/25/2001 Document  Revised: 09/21/2011 Document Reviewed: 11/24/2010 [facility] Patient Information 2013 [place], [place].

## 2012-07-31 NOTE — Assessment & Plan Note (Signed)
Improved some. Encouraged fiber supplement and probiotics.

## 2012-07-31 NOTE — Assessment & Plan Note (Signed)
Well controlled at present

## 2012-07-31 NOTE — Assessment & Plan Note (Signed)
Fasting labs reviewed with patient's patient eats a heart healthy diet and exercises regularly. Continue the same.

## 2012-07-31 NOTE — Assessment & Plan Note (Signed)
Improved some with yoga. Has recently started Collagen recently

## 2012-07-31 NOTE — Progress Notes (Signed)
Patient ID: Natalie [name], female   DOB: 1960-11-27, 52 y.o.   MRN: [id number] Lilli Dewald [id number] 07-31-1960 07/31/2012      Progress Note New Patient  Subjective  Chief Complaint  Chief Complaint  Patient presents with  . Annual Exam    physical    HPI  Patient is a 52 year old Caucasian female who is in today for annual exam. Overall she's although viral illness. She's had some mild congestion and neck and ear and eye discomfort. No fevers or chills only minimal congestion. She recently had to go to the breast center for additional imaging secondary to a questionable spot on the right breast. No further workup was needed and the patient is not having any symptoms of concern. No GI or GU complaints. No chest pain, palpitations, shortness of breath.  Past Medical History  Diagnosis Date  . Vitamin D deficiency 2-12  . Allergy     seasonal  . Bursitis of hip 1/11    left  . Hip pain, left 12/24/2010  . Constipation 12/24/2010  . Preventative health care 12/24/2010  . Recurrent oral ulcers 07/22/2011    Past Surgical History  Procedure Date  . Appendectomy 52 yrs old    Family History  Problem Relation Age of Onset  . Cancer Mother 78    breast- now bones and brain  . Cancer Father 54    prostate  . Heart disease Maternal Grandfather   . Stroke Paternal Grandfather     multiple mini strokes    History   Social History  . Marital Status: Married    Spouse Name: N/A    Number of Children: N/A  . Years of Education: N/A   Occupational History  . Not on file.   Social History Main Topics  . Smoking status: Former Smoker    Types: Cigarettes    Quit date: 07/09/2009  . Smokeless tobacco: Never Used  . Alcohol Use: Yes     Comment: 5 beers weekly  . Drug Use: No  . Sexually Active: Yes -- Female partner(s)   Other Topics Concern  . Not on file   Social History Narrative  . No narrative on file    Current Outpatient Prescriptions on File Prior to  Visit  Medication Sig Dispense Refill  . clonazePAM (KLONOPIN) 0.5 MG tablet Take 1 tablet (0.5 mg total) by mouth at bedtime as needed for anxiety (insomnia/anxiety).  10 tablet  0  . ergocalciferol (VITAMIN D2) 50000 UNITS capsule Take 50,000 Units by mouth. Every other week         No Known Allergies  Review of Systems  Review of Systems  Constitutional: Negative for fever, chills and malaise/fatigue.  HENT: Negative for hearing loss, nosebleeds and congestion.   Eyes: Negative for discharge.  Respiratory: Negative for cough, sputum production, shortness of breath and wheezing.   Cardiovascular: Negative for chest pain, palpitations and leg swelling.  Gastrointestinal: Negative for heartburn, nausea, vomiting, abdominal pain, diarrhea, constipation and blood in stool.  Genitourinary: Negative for dysuria, urgency, frequency and hematuria.  Musculoskeletal: Positive for joint pain. Negative for myalgias, back pain and falls.       Left hip pain  Skin: Negative for rash.  Neurological: Negative for dizziness, tremors, sensory change, focal weakness, loss of consciousness, weakness and headaches.  Endo/Heme/Allergies: Negative for polydipsia. Does not bruise/bleed easily.  Psychiatric/Behavioral: Negative for depression and suicidal ideas. The patient is not nervous/anxious and does not have insomnia.     Objective  BP 126/79  Pulse 76  Temp 98.9 F (37.2 C) (Temporal)  Ht 5\' 4"  (1.626 m)  Wt 138 lb 12.8 oz (62.959 kg)  BMI 23.82 kg/m2  SpO2 98%  LMP 07/15/2012  Physical Exam  Physical Exam  Constitutional: She is oriented to person, place, and time and well-developed, well-nourished, and in no distress. No distress.  HENT:  Head: Normocephalic and atraumatic.  Eyes: Conjunctivae normal are normal.  Neck: Neck supple. No thyromegaly present.  Cardiovascular: Normal rate, regular rhythm and normal heart sounds.   No murmur heard. Pulmonary/Chest: Effort normal and  breath sounds normal. She has no wheezes.  Abdominal: She exhibits no distension and no mass.  Musculoskeletal: She exhibits no edema.  Lymphadenopathy:    She has no cervical adenopathy.  Neurological: She is alert and oriented to person, place, and time.  Skin: Skin is warm and dry. No rash noted. She is not diaphoretic.  Psychiatric: Memory, affect and judgment normal.       Assessment & Plan  Recurrent oral ulcers Well controlled at present  Preventative health care Fasting labs reviewed with patient's patient eats a heart healthy diet and exercises regularly. Continue the same.   Hip pain, left Improved some with yoga. Has recently started Collagen recently  Constipation Improved some. Encouraged fiber supplement and probiotics.

## 2012-08-02 ENCOUNTER — Encounter: Payer: Self-pay | Admitting: Gastroenterology

## 2012-08-12 ENCOUNTER — Ambulatory Visit: Payer: 59 | Admitting: Family Medicine

## 2012-08-28 ENCOUNTER — Other Ambulatory Visit: Payer: Self-pay

## 2012-09-07 ENCOUNTER — Encounter: Payer: 59 | Admitting: Gastroenterology

## 2012-09-09 ENCOUNTER — Ambulatory Visit (AMBULATORY_SURGERY_CENTER): Payer: 59 | Admitting: *Deleted

## 2012-09-09 ENCOUNTER — Encounter: Payer: Self-pay | Admitting: Gastroenterology

## 2012-09-09 VITALS — Ht 64.0 in | Wt 140.4 lb

## 2012-09-09 DIAGNOSIS — Z1211 Encounter for screening for malignant neoplasm of colon: Secondary | ICD-10-CM

## 2012-09-09 MED ORDER — MOVIPREP 100 G PO SOLR: ORAL | Status: DC

## 2012-09-23 ENCOUNTER — Ambulatory Visit (AMBULATORY_SURGERY_CENTER): Payer: 59 | Admitting: Gastroenterology

## 2012-09-23 ENCOUNTER — Encounter: Payer: Self-pay | Admitting: Gastroenterology

## 2012-09-23 VITALS — BP 124/46 | HR 65 | Resp 17 | Ht 63.75 in | Wt 140.0 lb

## 2012-09-23 HISTORY — PX: COLONOSCOPY: SHX174

## 2012-09-23 MED ORDER — SODIUM CHLORIDE 0.9 % IV SOLN: 500.0000 mL | INTRAVENOUS | Status: DC

## 2012-09-23 NOTE — Progress Notes (Signed)
Patient did not experience any of the following events: a burn prior to discharge; a fall within the facility; wrong site/side/patient/procedure/implant event; or a hospital transfer or hospital admission upon discharge from the facility. (G8907) Patient did not have preoperative order for IV antibiotic SSI prophylaxis. (G8918)  

## 2012-09-23 NOTE — Patient Instructions (Addendum)

## 2012-09-23 NOTE — Op Note (Signed)
[facility] [address] [place], [address]   COLONOSCOPY PROCEDURE REPORT  PATIENT: Natalie [name], Natalie [name]  MR#: [id number] BIRTHDATE: [birth year] , 51  yrs. old GENDER: Female ENDOSCOPIST: Mardella Layman, MD, Clementeen Graham REFERRED BY:  Reuel Derby, M.D. PROCEDURE DATE:  09/23/2012 PROCEDURE:   Colorectal cancer screening - average risk patient ASA CLASS:   Class I INDICATIONS:Average risk patient for colon cancer. MEDICATIONS: Propofol (Diprivan) and propofol (Diprivan) 200mg  IV  DESCRIPTION OF PROCEDURE:   After the risks and benefits and of the procedure were explained, informed consent was obtained.  A digital rectal exam revealed no abnormalities of the rectum.    The LB [id number]  endoscope was introduced through the anus and advanced to the cecum, which was identified by both the appendix and ileocecal valve .  The quality of the prep was excellent, using MoviPrep .  The instrument was then slowly withdrawn as the colon was fully examined.     COLON FINDINGS: A normal appearing cecum, ileocecal valve, and appendiceal orifice were identified.  The ascending, hepatic flexure, transverse, splenic flexure, descending, sigmoid colon and rectum appeared unremarkable.  No polyps or cancers were seen. Retroflexed views revealed no abnormalities.     The scope was then withdrawn from the patient and the procedure completed.  COMPLICATIONS: There were no complications. ENDOSCOPIC IMPRESSION:This is a normal colonoscopy without any evidence of colonic polyposis or colon cancer.  There is an excellent prep and I cannot appreciate mucosal polypoid lesions. Normal colon  RECOMMENDATIONS: Continue current colorectal screening recommendations for "routine risk" patients with a repeat colonoscopy in 10 years.   REPEAT EXAM:  cc:  _______________________________ eSignedMardella Layman, MD, [facility] 09/23/2012 1:57 PM

## 2012-09-26 ENCOUNTER — Telehealth: Payer: Self-pay | Admitting: *Deleted

## 2012-09-26 NOTE — Telephone Encounter (Signed)
No answer, message left for the patient. 

## 2012-10-05 ENCOUNTER — Ambulatory Visit: Payer: 59

## 2012-10-06 ENCOUNTER — Ambulatory Visit
Admission: RE | Admit: 2012-10-06 | Discharge: 2012-10-06 | Disposition: A | Discharge status: Home / Self Care | Payer: 59 | Source: Ambulatory Visit | Attending: Obstetrics & Gynecology | Admitting: Obstetrics & Gynecology

## 2012-10-06 DIAGNOSIS — Z1231 Encounter for screening mammogram for malignant neoplasm of breast: Secondary | ICD-10-CM

## 2013-02-14 ENCOUNTER — Encounter: Payer: Self-pay | Admitting: Family

## 2013-02-14 ENCOUNTER — Ambulatory Visit (INDEPENDENT_AMBULATORY_CARE_PROVIDER_SITE_OTHER): Payer: 59 | Admitting: Family

## 2013-02-14 VITALS — BP 100/80 | HR 76 | Temp 98.3°F | Resp 16 | Wt 132.1 lb

## 2013-02-14 DIAGNOSIS — D1720 Benign lipomatous neoplasm of skin and subcutaneous tissue of unspecified limb: Secondary | ICD-10-CM | POA: Insufficient documentation

## 2013-02-14 DIAGNOSIS — R599 Enlarged lymph nodes, unspecified: Secondary | ICD-10-CM

## 2013-02-14 HISTORY — DX: Benign lipomatous neoplasm of skin and subcutaneous tissue of unspecified limb: D17.20

## 2013-02-14 MED ORDER — CEPHALEXIN 500 MG PO CAPS: 500.0000 mg | ORAL_CAPSULE | Freq: Four times a day (QID) | ORAL | Status: DC

## 2013-02-14 NOTE — Progress Notes (Signed)
  Subjective:    Patient ID: Prescott Parma, female    DOB: 28-Jun-1961, 52 y.o.   MRN: [id number]  HPI  Ms. Mahn is a 52 yr old female who presents today with chief complaint of left arm mass.  She reports that she first noticed this mass yesterday. Area is non-tender.  She denies associated fever.    Review of Systems     Objective:   Physical Exam  Constitutional: She is oriented to person, place, and time. She appears well-developed and well-nourished. No distress.  Lymphadenopathy:    She has no cervical adenopathy.    She has no axillary adenopathy.  Approximately 1.5 cm non tender mass consistent with epitrochlear lymph node  Neurological: She is alert and oriented to person, place, and time.  Psychiatric: She has a normal mood and affect. Her behavior is normal. Judgment and thought content normal.          Assessment & Plan:

## 2013-02-14 NOTE — Patient Instructions (Addendum)
Start Keflex.  Call if increased pain/swelling of the lymph node on your arm.  Follow up  In 2 weeks.

## 2013-02-14 NOTE — Assessment & Plan Note (Signed)
Likely reactive. Recommended trial of empiric keflex and a 2 week follow up- sooner if worsening.

## 2013-02-27 ENCOUNTER — Telehealth: Payer: Self-pay | Admitting: Family Medicine

## 2013-02-27 NOTE — Telephone Encounter (Signed)
FYI

## 2013-02-27 NOTE — Telephone Encounter (Signed)
Patient Information:  Caller Name: Marcee  Phone: [phone]  Patient: Natalie [name], Natalie [name]  Gender: Female  DOB: [birth year]  Age: 51 Years  PCP: Danise [facility])  Pregnant: No  Office Follow Up:  Does the office need to follow up with this patient?: No  Instructions For The Office: N/A  RN Note:  Husb/vasectomy. Left antecubital lymph node decreasing size; was walnut size before Keflex; currently node is almond or nickel size. 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 Has not found any other enlarged nodes on her body. Prefers to delay scheduling and continue to observe; will call for appointment in next two weeks if lymph node remains enlarged.  Symptoms  Reason For Call & Symptoms: Ongoing swollen left antecubital lymph node;  Node is 1/3 smaller than when seen 02/13/13 and completed antibiotics.  Reviewed Health History In EMR: Yes  Reviewed Medications In EMR: Yes  Reviewed Allergies In EMR: Yes  Reviewed Surgeries / Procedures: Yes  Date of Onset of Symptoms: 02/13/2013  Treatments Tried: Keflex  Treatments Tried Worked: No OB / GYN:  LMP: 02/14/2013  Guideline(s) Used:  Lymph Nodes - Swollen  Disposition Per Guideline:   See Within 2 Weeks in Office  Reason For Disposition Reached:   Large node present > 2 weeks  Advice Given:  Swollen Lymph Nodes from a Viral Infection:  Treatment: Usually no treatment is necessary.  Expected Course: After the infection is gone, the nodes slowly return to normal size over 1 to 2 weeks. However, they will not ever completely disappear.  RN Overrode Recommendation:  Follow Up With Office Later  Prefers not to schedule now; will call for apointment if remains enlarged.

## 2013-03-27 ENCOUNTER — Encounter: Payer: Self-pay | Admitting: Family Medicine

## 2013-03-27 ENCOUNTER — Ambulatory Visit (INDEPENDENT_AMBULATORY_CARE_PROVIDER_SITE_OTHER): Payer: 59 | Admitting: Family Medicine

## 2013-03-27 VITALS — BP 110/62 | HR 79 | Temp 98.2°F | Ht 64.0 in | Wt 135.0 lb

## 2013-03-27 DIAGNOSIS — M25552 Pain in left hip: Secondary | ICD-10-CM

## 2013-03-27 DIAGNOSIS — D1779 Benign lipomatous neoplasm of other sites: Secondary | ICD-10-CM

## 2013-03-27 DIAGNOSIS — M25559 Pain in unspecified hip: Secondary | ICD-10-CM

## 2013-03-27 DIAGNOSIS — D1720 Benign lipomatous neoplasm of skin and subcutaneous tissue of unspecified limb: Secondary | ICD-10-CM

## 2013-03-27 NOTE — Progress Notes (Signed)
Patient ID: Natalie [name], female   DOB: September 26, 1960, 52 y.o.   MRN: [id number] Natalie [name] [id number] 1960-08-03 03/27/2013      Progress Note-Follow Up  Subjective  Chief Complaint  Chief Complaint  Patient presents with  . Adenopathy    nymph nodes in arm    HPI  Patient is a 52 year old Caucasian female who is in today to discuss lesion on her left arm. She noticed in early August and since then it has not changed. It is nontender or warm. She has no difficulty paresthesias or weakness in the arm. She never suffered any trauma. She has not noted any lymphadenopathy, malaise myalgias chest pain palpitations shortness or breath GI or GU complaints. Does have ongoing left hip pain  Past Medical History  Diagnosis Date  . Vitamin D deficiency 2-12  . Allergy     seasonal  . Bursitis of hip 1/11    left  . Hip pain, left 12/24/2010  . Constipation 12/24/2010  . Preventative health care 12/24/2010  . Recurrent oral ulcers 07/22/2011    Past Surgical History  Procedure Laterality Date  . Appendectomy  52 yrs old    Family History  Problem Relation Age of Onset  . Cancer Mother 7    breast- now bones and brain  . Cancer Father 67    prostate  . Heart disease Maternal Grandfather   . Stroke Paternal Grandfather     multiple mini strokes  . Colon cancer Neg Hx     History   Social History  . Marital Status: Married    Spouse Name: N/A    Number of Children: N/A  . Years of Education: N/A   Occupational History  . Not on file.   Social History Main Topics  . Smoking status: Former Smoker    Types: Cigarettes    Quit date: 07/09/2009  . Smokeless tobacco: Never Used  . Alcohol Use: 1.8 oz/week    3 Glasses of wine per week     Comment: 5 beers weekly  . Drug Use: No  . Sexual Activity: Yes    Partners: Male   Other Topics Concern  . Not on file   Social History Narrative  . No narrative on file    Current Outpatient Prescriptions on File Prior to  Visit  Medication Sig Dispense Refill  . clonazePAM (KLONOPIN) 0.5 MG tablet Take 1 tablet (0.5 mg total) by mouth at bedtime as needed for anxiety (insomnia/anxiety).  10 tablet  0  . ergocalciferol (VITAMIN D2) 50000 UNITS capsule Take 50,000 Units by mouth once a week.        No current facility-administered medications on file prior to visit.    No Known Allergies  Review of Systems  Review of Systems  Constitutional: Negative for fever and malaise/fatigue.  HENT: Negative for congestion.   Eyes: Negative for discharge.  Respiratory: Negative for shortness of breath.   Cardiovascular: Negative for chest pain, palpitations and leg swelling.  Gastrointestinal: Negative for nausea, abdominal pain and diarrhea.  Genitourinary: Negative for dysuria.  Musculoskeletal: Negative for falls.  Skin: Negative for rash.  Neurological: Negative for loss of consciousness and headaches.  Endo/Heme/Allergies: Negative for polydipsia.  Psychiatric/Behavioral: Negative for depression and suicidal ideas. The patient is not nervous/anxious and does not have insomnia.     Objective  BP 110/62  Pulse 79  Temp(Src) 98.2 F (36.8 C) (Oral)  Ht 5\' 4"  (1.626 m)  Wt 135 lb (61.236 kg)  BMI  23.16 kg/m2  SpO2 97%  LMP 03/09/2013  Physical Exam  Physical Exam  Constitutional: She is oriented to person, place, and time and well-developed, well-nourished, and in no distress. No distress.  HENT:  Head: Normocephalic and atraumatic.  Eyes: Conjunctivae are normal.  Neck: Neck supple. No thyromegaly present.  Cardiovascular: Normal rate, regular rhythm and normal heart sounds.   No murmur heard. Pulmonary/Chest: Effort normal and breath sounds normal. She has no wheezes.  Abdominal: She exhibits no distension and no mass.  Musculoskeletal: She exhibits no edema.  Lymphadenopathy:    She has no cervical adenopathy.  Neurological: She is alert and oriented to person, place, and time.  Skin: Skin  is warm and dry. No rash noted. She is not diaphoretic.  Psychiatric: Memory, affect and judgment normal.    Lab Results  Component Value Date   TSH 0.89 07/15/2012   Lab Results  Component Value Date   WBC 3.6* 07/15/2012   HGB 12.9 07/15/2012   HCT 37.6 07/15/2012   MCV 92.5 07/15/2012   PLT 263.0 07/15/2012   Lab Results  Component Value Date   CREATININE 0.6 07/15/2012   BUN 16 07/15/2012   NA 137 07/15/2012   K 3.8 07/15/2012   CL 104 07/15/2012   CO2 27 07/15/2012   Lab Results  Component Value Date   ALT 17 07/15/2012   AST 16 07/15/2012   ALKPHOS 48 07/15/2012   BILITOT 0.8 07/15/2012   Lab Results  Component Value Date   CHOL 177 07/15/2012   Lab Results  Component Value Date   HDL 81.60 07/15/2012   Lab Results  Component Value Date   LDLCALC 87 07/15/2012   Lab Results  Component Value Date   TRIG 40.0 07/15/2012   Lab Results  Component Value Date   CHOLHDL 2 07/15/2012     Assessment & Plan  Hip pain, left Try the Salon Pas patches prn  Lipoma of arm Stable roughly 1 1/2 x 2 cm in Left upper arm, asymptomatic, no concerns on exam, patient will call if grows or becomes symptomatic

## 2013-03-27 NOTE — Assessment & Plan Note (Signed)
Try the Salon Pas patches prn

## 2013-03-27 NOTE — Patient Instructions (Addendum)
Consider physical therapist who does dry needling for the left hip pain  Salon Pas patches or cream  Lipoma A lipoma is a noncancerous (benign) tumor composed of fat cells. They are usually found under the skin (subcutaneous). A lipoma may occur in any tissue of the body that contains fat. Common areas for lipomas to appear include the back, shoulders, buttocks, and thighs. Lipomas are a very common soft tissue growth. They are soft and grow slowly. Most problems caused by a lipoma depend on where it is growing. DIAGNOSIS  A lipoma can be diagnosed with a physical exam. These tumors rarely become cancerous, but radiographic studies can help determine this for certain. Studies used may include:  Computerized X-ray scans (CT or CAT scan).  Computerized magnetic scans (MRI). TREATMENT  Small lipomas that are not causing problems may be watched. If a lipoma continues to enlarge or causes problems, removal is often the best treatment. Lipomas can also be removed to improve appearance. Surgery is done to remove the fatty cells and the surrounding capsule. Most often, this is done with medicine that numbs the area (local anesthetic). The removed tissue is examined under a microscope to make sure it is not cancerous. Keep all follow-up appointments with your caregiver. SEEK MEDICAL CARE IF:   The lipoma becomes larger or hard.  The lipoma becomes painful, red, or increasingly swollen. These could be signs of infection or a more serious condition. Document Released: 06/19/2002 Document Revised: 09/21/2011 Document Reviewed: 11/29/2009 [facility] Patient Information 2014 [place], [place].

## 2013-03-27 NOTE — Assessment & Plan Note (Signed)
Stable roughly 1 1/2 x 2 cm in Left upper arm, asymptomatic, no concerns on exam, patient will call if grows or becomes symptomatic

## 2013-05-18 ENCOUNTER — Other Ambulatory Visit: Payer: Self-pay

## 2013-06-29 ENCOUNTER — Telehealth: Payer: Self-pay | Admitting: *Deleted

## 2013-06-29 NOTE — Telephone Encounter (Signed)
Message copied by Alisa Graff on Thu Jun 29, 2013  6:11 PM ------      Message from: Jerene Bears      Created: Thu Jun 29, 2013  5:55 PM      Regarding: RE: recall       Out of recall.  Agree.            MSM      ----- Message -----         From: Armen Pickup, RN         Sent: 06/26/2013  11:36 AM           To: Annamaria Boots, MD      Subject: FW: recall                                               Report from 10-06-12 is in EPIC and recommends screening 1 year.  Will complete previous recall.  Does she need new recall?      Pre-EPIC chart to you office.      ----- Message -----         From: Araceli Bouche, CMA         Sent: 05/04/2013   8:31 AM           To: Armen Pickup, RN      Subject: RE: recall                                               Patients mammo from march 27th is in epic. See mammo      ----- Message -----         From: Armen Pickup, RN         Sent: 05/03/2013   5:56 PM           To: Araceli Bouche, CMA      Subject: recall                                                   Patient canceled [facility] in [facility] at [facility] nad has not rescheduled.  Please call her to assist in scheduling                   ------

## 2013-06-29 NOTE — Telephone Encounter (Signed)
Recall completed. 

## 2013-08-09 ENCOUNTER — Encounter: Payer: Self-pay | Admitting: Physician Assistant

## 2013-08-09 ENCOUNTER — Ambulatory Visit (INDEPENDENT_AMBULATORY_CARE_PROVIDER_SITE_OTHER): Payer: 59 | Admitting: Physician Assistant

## 2013-08-09 VITALS — BP 110/76 | HR 76 | Temp 98.9°F | Resp 14 | Ht 64.0 in | Wt 140.0 lb

## 2013-08-09 DIAGNOSIS — M67959 Unspecified disorder of synovium and tendon, unspecified thigh: Secondary | ICD-10-CM

## 2013-08-09 DIAGNOSIS — M719 Bursopathy, unspecified: Secondary | ICD-10-CM

## 2013-08-09 DIAGNOSIS — M6790 Unspecified disorder of synovium and tendon, unspecified site: Secondary | ICD-10-CM

## 2013-08-09 NOTE — Patient Instructions (Signed)
Please alternate tylenol and Ibuprofen.  Apply topical Icy Hot and use a heating pad.  Avoid over-exertion.  You will be contacted by Dr. Ericka Pontiff office for an appointment.

## 2013-08-10 DIAGNOSIS — M67959 Unspecified disorder of synovium and tendon, unspecified thigh: Secondary | ICD-10-CM | POA: Insufficient documentation

## 2013-08-10 DIAGNOSIS — M6790 Unspecified disorder of synovium and tendon, unspecified site: Secondary | ICD-10-CM | POA: Insufficient documentation

## 2013-08-10 HISTORY — DX: Unspecified disorder of synovium and tendon, unspecified thigh: M67.959

## 2013-08-10 NOTE — Progress Notes (Signed)
Patient presents to clinic today complaining of intermittent left upper leg pain, radiating to her knee, but has been present for the past 2 months. Patient endorses "injuring" her left upper leg and hip muscles about 5-6 years ago, while engaging in intercourse with her husband. Patient states she has seen a chiropractor many times for her symptoms, with little improvement. Patient states that the pain eventually subsided on its own. However, pain reappeared about 2-3 years ago. Patient was seen at [facility], but orthopedist. States that a hip x-ray was obtained which revealed she had bursitis. Patient states she was set up for physical therapy, but felt that it did no good. Patient states she avoid physical activity and to pain medicine. States pain eventually went away. Has not had a flare up until 2 months ago. Patient states that the pain feels like a pulling sensation that goes from her left knee up to her left groin. Patient does endorse good range of motion. States she gets relief from stretching. Patient denies low back pain, were sharp pain radiating from her back to her lower leg. Patient denies history of injury to her back or left hip. Patient states she's been athletic all of her life, so she knows she probably has some osteoarthritis. Patient is concerned about muscle tear.  Past Medical History  Diagnosis Date  . Vitamin D deficiency 2-12  . Allergy     seasonal  . Bursitis of hip 1/11    left  . Hip pain, left 12/24/2010  . Constipation 12/24/2010  . Preventative health care 12/24/2010  . Recurrent oral ulcers 07/22/2011  . Lipoma of arm 02/14/2013    Current Outpatient Prescriptions on File Prior to Visit  Medication Sig Dispense Refill  . clonazePAM (KLONOPIN) 0.5 MG tablet Take 1 tablet (0.5 mg total) by mouth at bedtime as needed for anxiety (insomnia/anxiety).  10 tablet  0  . ergocalciferol (VITAMIN D2) 50000 UNITS capsule Take 50,000 Units by mouth once a week.        No  current facility-administered medications on file prior to visit.    No Known Allergies  Family History  Problem Relation Age of Onset  . Cancer Mother 46    breast- now bones and brain  . Cancer Father 84    prostate  . Heart disease Maternal Grandfather   . Stroke Paternal Grandfather     multiple mini strokes  . Colon cancer Neg Hx     History   Social History  . Marital Status: Married    Spouse Name: N/A    Number of Children: N/A  . Years of Education: N/A   Social History Main Topics  . Smoking status: Former Smoker    Types: Cigarettes    Quit date: 07/09/2009  . Smokeless tobacco: Never Used  . Alcohol Use: 1.8 oz/week    3 Glasses of wine per week     Comment: 5 beers weekly  . Drug Use: No  . Sexual Activity: Yes    Partners: Male   Other Topics Concern  . None   Social History Narrative  . None   Review of Systems - See HPI.  All other ROS are negative.  Filed Vitals:   08/09/13 1716  BP: 110/76  Pulse: 76  Temp: 98.9 F (37.2 C)  Resp: 14   Physical Exam  Vitals reviewed. Constitutional: She is oriented to person, place, and time and well-developed, well-nourished, and in no distress.  HENT:  Head: Normocephalic and  atraumatic.  Eyes: Conjunctivae are normal.  Cardiovascular: Normal rate, regular rhythm and normal heart sounds.   Pulmonary/Chest: Breath sounds normal. No respiratory distress. She has no wheezes. She has no rales. She exhibits no tenderness.  Musculoskeletal:       Left hip: She exhibits normal range of motion, normal strength, no bony tenderness, no swelling, no crepitus, no deformity and no laceration.       Left knee: Normal.       Left ankle: Normal.  Pain with external rotation of left hip.  Mild pain with abduction of left hip.  Neurological: She is alert and oriented to person, place, and time. She has normal reflexes. No cranial nerve deficit. Gait normal. Coordination normal.  Skin: Skin is warm and dry. No rash  noted.  Psychiatric: Affect normal.    No results found for this or any previous visit (from the past 2160 hour(s)).  Assessment/Plan: Tendinopathy involving hip Avoid over exertion. Topical slumped posture Aspercreme. Alternate Tylenol and ibuprofen for pain. Referral made to sports medicine for further evaluation and treatment of chronic tendinopathy.

## 2013-08-10 NOTE — Assessment & Plan Note (Signed)
Avoid over exertion. Topical slumped posture Aspercreme. Alternate Tylenol and ibuprofen for pain. Referral made to sports medicine for further evaluation and treatment of chronic tendinopathy.

## 2013-08-16 ENCOUNTER — Ambulatory Visit: Payer: 59 | Admitting: Family Medicine

## 2013-08-17 ENCOUNTER — Ambulatory Visit (INDEPENDENT_AMBULATORY_CARE_PROVIDER_SITE_OTHER): Payer: 59 | Admitting: Family Medicine

## 2013-08-17 ENCOUNTER — Encounter: Payer: Self-pay | Admitting: Family Medicine

## 2013-08-17 VITALS — BP 113/78 | HR 76 | Ht 64.0 in | Wt 140.0 lb

## 2013-08-17 DIAGNOSIS — M25552 Pain in left hip: Secondary | ICD-10-CM

## 2013-08-17 DIAGNOSIS — M25559 Pain in unspecified hip: Secondary | ICD-10-CM

## 2013-08-17 NOTE — Patient Instructions (Signed)
I'm concerned about you potentially having a hip labral tear. We will start with conservative treatment for this. Start physical therapy for strengthening and do home exercises on days you don't go to therapy. Aleve 2 tabs twice a day with food OR ibuprofen 600mg  three times a day with food for pain and inflammation. Consider hip injection by interventional radiology or imaging (repeating x-rays, MRI arthrogram) if not improving. Follow up with me in 6 weeks.

## 2013-08-21 ENCOUNTER — Encounter: Payer: Self-pay | Admitting: Family Medicine

## 2013-08-21 NOTE — Progress Notes (Signed)
Patient ID: Natalie [name], female   DOB: 01-28-1961, 53 y.o.   MRN: [id number]  PCP: Penni Homans, MD  Subjective:   HPI: Patient is a 53 y.o. female here for left hip pain.  Patient reports she's had current pain in left hip for about 6 weeks. Had similar pain about 3 years ago but that was confined to lateral hip - resolved. That time had x-rays negative for arthritis.  Did PT and home exercise program and improved after 8-10 weeks. Currently difficult to get comfortable. Worse with stairs, squatting, lifting, lying on left side. Using foam roller. Some popping. Pain more in groin this time. No numbness/tingling. No back pain.  Past Medical History  Diagnosis Date  . Vitamin D deficiency 2-12  . Allergy     seasonal  . Bursitis of hip 1/11    left  . Hip pain, left 12/24/2010  . Constipation 12/24/2010  . Preventative health care 12/24/2010  . Recurrent oral ulcers 07/22/2011  . Lipoma of arm 02/14/2013    Current Outpatient Prescriptions on File Prior to Visit  Medication Sig Dispense Refill  . clonazePAM (KLONOPIN) 0.5 MG tablet Take 1 tablet (0.5 mg total) by mouth at bedtime as needed for anxiety (insomnia/anxiety).  10 tablet  0  . ergocalciferol (VITAMIN D2) 50000 UNITS capsule Take 50,000 Units by mouth once a week.        No current facility-administered medications on file prior to visit.    Past Surgical History  Procedure Laterality Date  . Appendectomy  53 yrs old    No Known Allergies  History   Social History  . Marital Status: Married    Spouse Name: N/A    Number of Children: N/A  . Years of Education: N/A   Occupational History  . Not on file.   Social History Main Topics  . Smoking status: Former Smoker    Types: Cigarettes    Quit date: 07/09/2009  . Smokeless tobacco: Never Used  . Alcohol Use: 1.8 oz/week    3 Glasses of wine per week     Comment: 5 beers weekly  . Drug Use: No  . Sexual Activity: Yes    Partners: Male   Other  Topics Concern  . Not on file   Social History Narrative  . No narrative on file    Family History  Problem Relation Age of Onset  . Cancer Mother 71    breast- now bones and brain  . Cancer Father 108    prostate  . Heart disease Maternal Grandfather   . Stroke Paternal Grandfather     multiple mini strokes  . Colon cancer Neg Hx     BP 113/78  Pulse 76  Ht 5\' 4"  (1.626 m)  Wt 140 lb (63.504 kg)  BMI 24.02 kg/m2  LMP 07/26/2013  Review of Systems: See HPI above.    Objective:  Physical Exam:  Gen: NAD  Back/L hip: No gross deformity, scoliosis. Minimal trochanter tenderness.  No other TTP.  No midline or bony TTP. FROM with painful hip motion. Strength LEs 5/5 all muscle groups.   2+ MSRs in patellar and achilles tendons, equal bilaterally. Negative SLRs. Sensation intact to light touch bilaterally. Positive left hip logroll.  Negative right. Negative fabers and piriformis stretches.    Assessment & Plan:  1. Left hip pain - prior radiographs negative.  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  Will try conservative treatment - physical therapy, nsaids.  Consider hip  injection or further imaging if not improving over next 6 weeks.

## 2013-08-21 NOTE — Assessment & Plan Note (Signed)
prior radiographs negative.  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  Will try conservative treatment - physical therapy, nsaids.  Consider hip injection or further imaging if not improving over next 6 weeks.

## 2013-08-25 ENCOUNTER — Encounter: Payer: Self-pay | Admitting: *Deleted

## 2013-08-31 ENCOUNTER — Other Ambulatory Visit: Payer: Self-pay

## 2013-08-31 DIAGNOSIS — Z1231 Encounter for screening mammogram for malignant neoplasm of breast: Secondary | ICD-10-CM

## 2013-09-14 ENCOUNTER — Encounter: Payer: Self-pay | Admitting: Certified Nurse Midwife

## 2013-09-18 ENCOUNTER — Ambulatory Visit (INDEPENDENT_AMBULATORY_CARE_PROVIDER_SITE_OTHER): Payer: 59 | Admitting: Certified Nurse Midwife

## 2013-09-18 ENCOUNTER — Encounter: Payer: Self-pay | Admitting: Certified Nurse Midwife

## 2013-09-18 VITALS — BP 99/64 | HR 72 | Resp 16 | Ht 64.0 in | Wt 141.0 lb

## 2013-09-18 DIAGNOSIS — Z0000 Encounter for general adult medical examination without abnormal findings: Secondary | ICD-10-CM

## 2013-09-18 DIAGNOSIS — Z01419 Encounter for gynecological examination (general) (routine) without abnormal findings: Secondary | ICD-10-CM

## 2013-09-18 NOTE — Patient Instructions (Signed)
General topics  Next pap or exam is  due in 1 year Take a Women's multivitamin Take 1200 mg. of calcium daily - prefer dietary If any concerns in interim to call back  Breast Self-Awareness Practicing breast self-awareness may pick up problems early, prevent significant medical complications, and possibly save your life. By practicing breast self-awareness, you can become familiar with how your breasts look and feel and if your breasts are changing. This allows you to notice changes early. It can also offer you some reassurance that your breast health is good. One way to learn what is normal for your breasts and whether your breasts are changing is to do a breast self-exam. If you find a lump or something that was not present in the past, it is best to contact your caregiver right away. Other findings that should be evaluated by your caregiver include nipple discharge, especially if it is bloody; skin changes or reddening; areas where the skin seems to be pulled in (retracted); or new lumps and bumps. Breast pain is seldom associated with cancer (malignancy), but should also be evaluated by a caregiver. BREAST SELF-EXAM The best time to examine your breasts is 5 7 days after your menstrual period is over.  ExitCare Patient Information 2013 [employer].   Exercise to Stay Healthy Exercise helps you become and stay healthy. EXERCISE IDEAS AND TIPS Choose exercises that:  You enjoy.  Fit into your day. You do not need to exercise really hard to be healthy. You can do exercises at a slow or medium level and stay healthy. You can:  Stretch before and after working out.  Try yoga, Pilates, or tai chi.  Lift weights.  Walk fast, swim, jog, run, climb stairs, bicycle, dance, or rollerskate.  Take aerobic classes. Exercises that burn about 150 calories:  Running 1  miles in 15 minutes.  Playing volleyball for 45 to 60 minutes.  Washing and waxing a car for 45 to 60  minutes.  Playing touch football for 45 minutes.  Walking 1  miles in 35 minutes.  Pushing a stroller 1  miles in 30 minutes.  Playing basketball for 30 minutes.  Raking leaves for 30 minutes.  Bicycling 5 miles in 30 minutes.  Walking 2 miles in 30 minutes.  Dancing for 30 minutes.  Shoveling snow for 15 minutes.  Swimming laps for 20 minutes.  Walking up stairs for 15 minutes.  Bicycling 4 miles in 15 minutes.  Gardening for 30 to 45 minutes.  Jumping rope for 15 minutes.  Washing windows or floors for 45 to 60 minutes. Document Released: 08/01/2010 Document Revised: 09/21/2011 Document Reviewed: 08/01/2010 ExitCare Patient Information 2013 [employer].   Other topics ( that may be useful information):    Sexually Transmitted Disease Sexually transmitted disease (STD) refers to any infection that is passed from person to person during sexual activity. This may happen by way of saliva, semen, blood, vaginal mucus, or urine. Common STDs include:  Gonorrhea.  Chlamydia.  Syphilis.  HIV/AIDS.  Genital herpes.  Hepatitis B and C.  Trichomonas.  Human papillomavirus (HPV).  Pubic lice. CAUSES  An STD may be spread by bacteria, virus, or parasite. A person can get an STD by:  Sexual intercourse with an infected person.  Sharing sex toys with an infected person.  Sharing needles with an infected person.  Having intimate contact with the genitals, mouth, or rectal areas of an infected person. SYMPTOMS  Some people may not have any symptoms, but   they can still pass the infection to others. Different STDs have different symptoms. Symptoms include:  Painful or bloody urination.  Pain in the pelvis, abdomen, vagina, anus, throat, or eyes.  Skin rash, itching, irritation, growths, or sores (lesions). These usually occur in the genital or anal area.  Abnormal vaginal discharge.  Penile discharge in men.  Soft, flesh-colored skin growths in the  genital or anal area.  Fever.  Pain or bleeding during sexual intercourse.  Swollen glands in the groin area.  Yellow skin and eyes (jaundice). This is seen with hepatitis. DIAGNOSIS  To make a diagnosis, your caregiver may:  Take a medical history.  Perform a physical exam.  Take a specimen (culture) to be examined.  Examine a sample of discharge under a microscope.  Perform blood test TREATMENT   Chlamydia, gonorrhea, trichomonas, and syphilis can be cured with antibiotic medicine.  Genital herpes, hepatitis, and HIV can be treated, but not cured, with prescribed medicines. The medicines will lessen the symptoms.  Genital warts from HPV can be treated with medicine or by freezing, burning (electrocautery), or surgery. Warts may come back.  HPV is a virus and cannot be cured with medicine or surgery.However, abnormal areas may be followed very closely by your caregiver and may be removed from the cervix, vagina, or vulva through office procedures or surgery. If your diagnosis is confirmed, your recent sexual partners need treatment. This is true even if they are symptom-free or have a negative culture or evaluation. They should not have sex until their caregiver says it is okay. HOME CARE INSTRUCTIONS  All sexual partners should be informed, tested, and treated for all STDs.  Take your antibiotics as directed. Finish them even if you start to feel better.  Only take over-the-counter or prescription medicines for pain, discomfort, or fever as directed by your caregiver.  Rest.  Eat a balanced diet and drink enough fluids to keep your urine clear or pale yellow.  Do not have sex until treatment is completed and you have followed up with your caregiver. STDs should be checked after treatment.  Keep all follow-up appointments, Pap tests, and blood tests as directed by your caregiver.  Only use latex condoms and water-soluble lubricants during sexual activity. Do not use  petroleum jelly or oils.  Avoid alcohol and illegal drugs.  Get vaccinated for HPV and hepatitis. If you have not received these vaccines in the past, talk to your caregiver about whether one or both might be right for you.  Avoid risky sex practices that can break the skin. The only way to avoid getting an STD is to avoid all sexual activity.Latex condoms and dental dams (for oral sex) will help lessen the risk of getting an STD, but will not completely eliminate the risk. SEEK MEDICAL CARE IF:   You have a fever.  You have any new or worsening symptoms. Document Released: 09/19/2002 Document Revised: 09/21/2011 Document Reviewed: 09/26/2010 [facility] Patient Information 2013 [employer].    Domestic Abuse You are being battered or abused if someone close to you hits, pushes, or physically hurts you in any way. You also are being abused if you are forced into activities. You are being sexually abused if you are forced to have sexual contact of any kind. You are being emotionally abused if you are made to feel worthless or if you are constantly threatened. It is important to remember that help is available. No one has the right to abuse you. PREVENTION OF FURTHER  ABUSE  Learn the warning signs of danger. This varies with situations but may include: the use of alcohol, threats, isolation from friends and family, or forced sexual contact. Leave if you feel that violence is going to occur.  If you are attacked or beaten, report it to the police so the abuse is documented. You do not have to press charges. The police can protect you while you or the attackers are leaving. Get the officer's name and badge number and a copy of the report.  Find someone you can trust and tell them what is happening to you: your caregiver, a nurse, clergy member, close friend or family member. Feeling ashamed is natural, but remember that you have done nothing wrong. No one deserves abuse. Document Released:  06/26/2000 Document Revised: 09/21/2011 Document Reviewed: 09/04/2010 ExitCare Patient Information 2013 [employer].    How Much is Too Much Alcohol? Drinking too much alcohol can cause injury, accidents, and health problems. These types of problems can include:   Car crashes.  Falls.  Family fighting (domestic violence).  Drowning.  Fights.  Injuries.  Burns.  Damage to certain organs.  Having a baby with birth defects. ONE DRINK CAN BE TOO MUCH WHEN YOU ARE:  Working.  Pregnant or breastfeeding.  Taking medicines. Ask your doctor.  Driving or planning to drive. If you or someone you know has a drinking problem, get help from a doctor.  Document Released: 04/25/2009 Document Revised: 09/21/2011 Document Reviewed: 04/25/2009 ExitCare Patient Information 2013 [employer].   Smoking Hazards Smoking cigarettes is extremely bad for your health. Tobacco smoke has over 200 known poisons in it. There are over 60 chemicals in tobacco smoke that cause cancer. Some of the chemicals found in cigarette smoke include:   Cyanide.  Benzene.  Formaldehyde.  Methanol (wood alcohol).  Acetylene (fuel used in welding torches).  Ammonia. Cigarette smoke also contains the poisonous gases nitrogen oxide and carbon monoxide.  Cigarette smokers have an increased risk of many serious medical problems and Smoking causes approximately:  90% of all lung cancer deaths in men.  80% of all lung cancer deaths in women.  90% of deaths from chronic obstructive lung disease. Compared with nonsmokers, smoking increases the risk of:  Coronary heart disease by 2 to 4 times.  Stroke by 2 to 4 times.  Men developing lung cancer by 23 times.  Women developing lung cancer by 13 times.  Dying from chronic obstructive lung diseases by 12 times.  . Smoking is the most preventable cause of death and disease in our society.  WHY IS SMOKING ADDICTIVE?  Nicotine is the chemical  agent in tobacco that is capable of causing addiction or dependence.  When you smoke and inhale, nicotine is absorbed rapidly into the bloodstream through your lungs. Nicotine absorbed through the lungs is capable of creating a powerful addiction. Both inhaled and non-inhaled nicotine may be addictive.  Addiction studies of cigarettes and spit tobacco show that addiction to nicotine occurs mainly during the teen years, when young people begin using tobacco products. WHAT ARE THE BENEFITS OF QUITTING?  There are many health benefits to quitting smoking.   Likelihood of developing cancer and heart disease decreases. Health improvements are seen almost immediately.  Blood pressure, pulse rate, and breathing patterns start returning to normal soon after quitting. QUITTING SMOKING   American Lung Association - 1-800-LUNGUSA  American Cancer Society - 1-800-ACS-2345 Document Released: 08/06/2004 Document Revised: 09/21/2011 Document Reviewed: 04/10/2009 ExitCare Patient Information 2013 ExitCare[employer]   LLC.   Stress Management Stress is a state of physical or mental tension that often results from changes in your life or normal routine. Some common causes of stress are:  Death of a loved one.  Injuries or severe illnesses.  Getting fired or changing jobs.  Moving into a new home. Other causes may be:  Sexual problems.  Business or financial losses.  Taking on a large debt.  Regular conflict with someone at home or at work.  Constant tiredness from lack of sleep. It is not just bad things that are stressful. It may be stressful to:  Win the lottery.  Get married.  Buy a new car. The amount of stress that can be easily tolerated varies from person to person. Changes generally cause stress, regardless of the types of change. Too much stress can affect your health. It may lead to physical or emotional problems. Too little stress (boredom) may also become stressful. SUGGESTIONS TO  REDUCE STRESS:  Talk things over with your family and friends. It often is helpful to share your concerns and worries. If you feel your problem is serious, you may want to get help from a professional counselor.  Consider your problems one at a time instead of lumping them all together. Trying to take care of everything at once may seem impossible. List all the things you need to do and then start with the most important one. Set a goal to accomplish 2 or 3 things each day. If you expect to do too many in a single day you will naturally fail, causing you to feel even more stressed.  Do not use alcohol or drugs to relieve stress. Although you may feel better for a short time, they do not remove the problems that caused the stress. They can also be habit forming.  Exercise regularly - at least 3 times per week. Physical exercise can help to relieve that "uptight" feeling and will relax you.  The shortest distance between despair and hope is often a good night's sleep.  Go to bed and get up on time allowing yourself time for appointments without being rushed.  Take a short "time-out" period from any stressful situation that occurs during the day. Close your eyes and take some deep breaths. Starting with the muscles in your face, tense them, hold it for a few seconds, then relax. Repeat this with the muscles in your neck, shoulders, hand, stomach, back and legs.  Take good care of yourself. Eat a balanced diet and get plenty of rest.  Schedule time for having fun. Take a break from your daily routine to relax. HOME CARE INSTRUCTIONS   Call if you feel overwhelmed by your problems and feel you can no longer manage them on your own.  Return immediately if you feel like hurting yourself or someone else. Document Released: 12/23/2000 Document Revised: 09/21/2011 Document Reviewed: 08/15/2007 ExitCare Patient Information 2013 [employer].   

## 2013-09-18 NOTE — Progress Notes (Signed)
Reviewed personally.  M. Suzanne Kenedee Molesky, MD.  

## 2013-09-18 NOTE — Progress Notes (Signed)
53 y.o. G1P0 Married Caucasian Fe here for annual exam. Periods changing of missed one period in 2014, otherwise 21-24 day cycle. Sees PCP for aex and had labs with work. Patient's PCP was sent to My chart, unable to print for patient today. She has the results on her phone today. Patient saw PT for joint pain in the past, saw MD, but unhappy with and now under treatment with specialist in [facility]. Constipation resolved with yogurt and chia sees daily. No health issues today.  Labs viewed: Vitamin D 51,Lipid panel: 204,LDL 90,HDL 107, Triglycerides 37, Hgb.A1-c 5.0, CMP WNL, TSH WNL (copy to be sent for chart)  Patient's last menstrual period was 09/07/2013.          Sexually active: yes  The current method of family planning is vasectomy.    Exercising: yes  stretching & walking Smoker:  no  Health Maintenance: Pap: 09-05-12 neg HPV HR neg MMG:  3/14 neg. Colonoscopy: 3/14 neg. 10 years BMD:   none TDaP:  2012 Labs: none Self breast exam: done occ    reports that she quit smoking about 4 years ago. Her smoking use included Cigarettes. She smoked 0.00 packs per day. She has never used smokeless tobacco. She reports that she drinks about 2.0 ounces of alcohol per week. She reports that she does not use illicit drugs.  Past Medical History  Diagnosis Date  . Vitamin D deficiency 2-12  . Allergy     seasonal  . Bursitis of hip 1/11    left  . Hip pain, left 12/24/2010  . Constipation 12/24/2010  . Preventative health care 12/24/2010  . Recurrent oral ulcers 07/22/2011  . Lipoma of arm 02/14/2013  . HSV-1 (herpes simplex virus 1) infection   . HPV test positive   . Fracture of arm 2004  . Mononucleosis   . Abnormal uterine bleeding   . Abnormal Pap smear of cervix 1996  . Anxiety     past    Past Surgical History  Procedure Laterality Date  . Appendectomy  53 yrs old  . Intrauterine device insertion  09/24/1997    Paraguard  . Cervical biopsy  w/ loop electrode excision  1996     LGSIL    Current Outpatient Prescriptions  Medication Sig Dispense Refill  . aspirin-acetaminophen-caffeine (EXCEDRIN MIGRAINE) 250-250-65 MG per tablet Take by mouth every 6 (six) hours as needed for headache.      Marland Kitchen CALCIUM PO Take by mouth daily.      . ergocalciferol (VITAMIN D2) 50000 UNITS capsule Take 50,000 Units by mouth every 14 (fourteen) days.       Marland Kitchen glucosamine-chondroitin 500-400 MG tablet Take 1 tablet by mouth daily.      . Multiple Vitamins-Minerals (MULTIVITAMIN PO) Take by mouth daily.       No current facility-administered medications for this visit.    Family History  Problem Relation Age of Onset  . Cancer Mother 73    breast- now bones and brain  . Hypertension Mother   . Heart disease Mother   . Breast cancer Mother   . Cancer Father 54    prostate  . Heart disease Maternal Grandfather   . Stroke Paternal Grandfather     multiple mini strokes  . Tuberculosis Paternal Grandfather   . Colon cancer Neg Hx   . Osteoporosis Paternal Grandmother     ROS:  Pertinent items are noted in HPI.  Otherwise, a comprehensive ROS was negative.  Exam:  BP 99/64  Pulse 72  Resp 16  Ht 5\' 4"  (1.626 m)  Wt 141 lb (63.957 kg)  BMI 24.19 kg/m2  LMP 09/07/2013 Height: 5\' 4"  (162.6 cm)  Ht Readings from Last 3 Encounters:  09/18/13 5\' 4"  (1.626 m)  08/17/13 5\' 4"  (1.626 m)  08/09/13 5\' 4"  (1.626 m)    General appearance: alert, cooperative and appears stated age Head: Normocephalic, without obvious abnormality, atraumatic Neck: no adenopathy, supple, symmetrical, trachea midline and thyroid normal to inspection and palpation and non-palpable Lungs: clear to auscultation bilaterally Breasts: normal appearance, no masses or tenderness, No nipple retraction or dimpling, No nipple discharge or bleeding, No axillary or supraclavicular adenopathy Heart: regular rate and rhythm Abdomen: soft, non-tender; no masses,  no organomegaly Extremities: extremities normal,  atraumatic, no cyanosis or edema Skin: Skin color, texture, turgor normal. No rashes or lesions Lymph nodes: Cervical, supraclavicular, and axillary nodes normal. No abnormal inguinal nodes palpated Neurologic: Grossly normal   Pelvic: External genitalia:  no lesions              Urethra:  normal appearing urethra with no masses, tenderness or lesions              Bartholin's and Skene's: normal                 Vagina: normal appearing vagina with normal color and discharge, no lesions              Cervix: normal, non tender              Pap taken: yes Bimanual Exam:  Uterus:  normal size, contour, position, consistency, mobility, non-tender and anteverted              Adnexa: normal adnexa and no mass, fullness, tenderness               Rectovaginal: Confirms               Anus:  normal sphincter tone, no lesions  A:  Well Woman with normal exam  Perimenopausal with occasional cycle change  Labs reviewed  Joint pain under treatment    P:   Reviewed health and wellness pertinent to exam  Discussed etiology of perimenopause and expectations. Discussed  Need to advise if has no period in 3 months in this year.  Discussed Lipid profile appropriate with good HDL, continue regular diet and exercise, No Vitamin D Rx needed now, Start Vitamin D3 1000 IU daily now.  Continue follow up as indicated  Pap smear as per guidelines   Mammogram yearly with 3D this year pap smear taken today with reflex  counseled on breast self exam, mammography screening, adequate intake of calcium and vitamin D, diet and exercise  return annually or prn  An After Visit Summary was printed and given to the patient.

## 2013-09-19 LAB — IPS PAP TEST WITH REFLEX TO HPV

## 2013-09-28 ENCOUNTER — Ambulatory Visit: Payer: 59 | Admitting: Family Medicine

## 2013-10-09 ENCOUNTER — Ambulatory Visit
Admission: RE | Admit: 2013-10-09 | Discharge: 2013-10-09 | Disposition: A | Discharge status: Home / Self Care | Payer: 59 | Source: Ambulatory Visit

## 2013-10-09 DIAGNOSIS — Z1231 Encounter for screening mammogram for malignant neoplasm of breast: Secondary | ICD-10-CM

## 2013-12-01 ENCOUNTER — Encounter: Payer: Self-pay | Admitting: Physician Assistant

## 2013-12-01 ENCOUNTER — Ambulatory Visit (INDEPENDENT_AMBULATORY_CARE_PROVIDER_SITE_OTHER): Payer: 59 | Admitting: Physician Assistant

## 2013-12-01 VITALS — BP 122/84 | HR 72 | Temp 98.4°F | Resp 14 | Ht 64.0 in | Wt 143.8 lb

## 2013-12-01 DIAGNOSIS — J060 Acute laryngopharyngitis: Secondary | ICD-10-CM | POA: Insufficient documentation

## 2013-12-01 LAB — POCT RAPID STREP A (OFFICE): RAPID STREP A SCREEN: NEGATIVE

## 2013-12-01 MED ORDER — FLUTICASONE PROPIONATE 50 MCG/ACT NA SUSP: 2.0000 | Freq: Every day | NASAL | Status: DC

## 2013-12-01 MED ORDER — AZITHROMYCIN 250 MG PO TABS: ORAL_TABLET | ORAL | Status: DC

## 2013-12-01 NOTE — Progress Notes (Signed)
Pre visit review using our clinic review tool, if applicable. No additional management support is needed unless otherwise documented below in the visit note/SLS  

## 2013-12-01 NOTE — Progress Notes (Signed)
Patient presents to clinic today c/o 3 days of sinus pressure, post-nasal drip, scratchy throat and hoarseness.  Also notes some chills and fatigue.  Denies fever, shortness of breath or wheezing.  Very mild, dry cough that has changed to a productive cough since this am.  Denies recent travel or sick contact.  Past Medical History  Diagnosis Date  . Vitamin D deficiency 2-12  . Allergy     seasonal  . Bursitis of hip 1/11    left  . Hip pain, left 12/24/2010  . Constipation 12/24/2010  . Preventative health care 12/24/2010  . Recurrent oral ulcers 07/22/2011  . Lipoma of arm 02/14/2013  . HSV-1 (herpes simplex virus 1) infection   . HPV test positive   . Fracture of arm 2004  . Mononucleosis   . Abnormal uterine bleeding   . Abnormal Pap smear of cervix 1996  . Anxiety     past    Current Outpatient Prescriptions on File Prior to Visit  Medication Sig Dispense Refill  . aspirin-acetaminophen-caffeine (EXCEDRIN MIGRAINE) 250-250-65 MG per tablet Take by mouth every 6 (six) hours as needed for headache.      Marland Kitchen CALCIUM PO Take by mouth daily.      Marland Kitchen glucosamine-chondroitin 500-400 MG tablet Take 1 tablet by mouth daily.      . Multiple Vitamins-Minerals (MULTIVITAMIN PO) Take by mouth daily.       No current facility-administered medications on file prior to visit.    No Known Allergies  Family History  Problem Relation Age of Onset  . Cancer Mother 75    breast- now bones and brain  . Hypertension Mother   . Heart disease Mother   . Breast cancer Mother   . Cancer Father 37    prostate  . Heart disease Maternal Grandfather   . Stroke Paternal Grandfather     multiple mini strokes  . Tuberculosis Paternal Grandfather   . Colon cancer Neg Hx   . Osteoporosis Paternal Grandmother     History   Social History  . Marital Status: Married    Spouse Name: N/A    Number of Children: N/A  . Years of Education: N/A   Social History Main Topics  . Smoking status: Former  Smoker    Types: Cigarettes    Quit date: 07/09/2009  . Smokeless tobacco: Never Used  . Alcohol Use: 2.0 oz/week    4 drink(s) per week  . Drug Use: No  . Sexual Activity: Yes    Partners: Male     Comment: husband vasectomy   Other Topics Concern  . None   Social History Narrative  . None   Review of Systems - See HPI.  All other ROS are negative.  BP 122/84  Pulse 72  Temp(Src) 98.4 F (36.9 C) (Oral)  Resp 14  Ht 5\' 4"  (1.626 m)  Wt 143 lb 12 oz (65.205 kg)  BMI 24.66 kg/m2  SpO2 98%  LMP 11/01/2013  Physical Exam  Vitals reviewed. Constitutional: She is oriented to person, place, and time and well-developed, well-nourished, and in no distress.  HENT:  Head: Normocephalic and atraumatic.  Right Ear: External ear normal.  Left Ear: External ear normal.  Nose: Nose normal.  Mouth/Throat: Oropharynx is clear and moist. No oropharyngeal exudate.  Mild tenderness of frontal sinuses noted.  TM within normal limits bilaterally.  Eyes: Conjunctivae are normal. Pupils are equal, round, and reactive to light.  Neck: Neck supple.  Cardiovascular: Normal  rate, regular rhythm, normal heart sounds and intact distal pulses.   Pulmonary/Chest: Effort normal and breath sounds normal. No respiratory distress. She has no wheezes. She has no rales. She exhibits no tenderness.  Lymphadenopathy:    She has no cervical adenopathy.  Neurological: She is alert and oriented to person, place, and time.  Skin: Skin is warm and dry. No rash noted.  Psychiatric: Affect normal.    Recent Results (from the past 2160 hour(s))  IPS PAP TEST WITH REFLEX TO HPV     Status: None   Collection Time    09/18/13 12:00 AM      Result Value Ref Range   COMMENTS: [facility]     Comment: [place], [place] [address]     [place] [address]     GYN CYTOLOGY REPORT      PATIENT NAME:Markwell, Loella PATHOLOGY#:[id number]: F DOB: 09/01/1960  (Age: 53) MEDICAL RECORD NUMBER:[id number] DOCTOR:Debbie Hollice Espy, CNM DATE OBTAINED:3/9/2015CLIENT:[facility] DATE RECEIVED:3/10/2015OTHER PHYS: DATE SIGNED:09/19/2013     PAP Thinlayer with Reflex to HPV     Final Cytologic Interpretation:           Cervical, Thin Layer with Automated Imaging and Dual Review, CPT 88175          Negative for Intraepithelial Lesions or Malignancy.               ADEQUACY OF SPECIMEN:               Satisfactory for evaluation. Endocervical cells/transformation zone component identified.                     Electronically Signed Out Bydh/3/10/2015[facility], CT(ASCP)[address], [address], [place], [place]. Dir.: Betsy Coder Pap test is a screening mechanism with excellent but not perfect ability to prevent cervical carcinoma.  It has a low, but      significant, diagnostic error rate. The pap test is suboptimal  for detection of glandular lesions.  It should be noted that a negative result does not definitively rule out the presence of disease.Ref: DeMay, RM, The Art and Science of Cytopathology,      [employer], [phone].     Last Menstrual Period: 09/07/13      Other Clinical Conditions: Other: Hx of LEEP or LSIL 96  POCT RAPID STREP A (OFFICE)     Status: None   Collection Time    12/01/13 11:34 AM      Result Value Ref Range   Rapid Strep A Screen Negative  Negative   Assessment/Plan: Sore throat and laryngitis Rapid strep negative.  Physical exam unremarkable except for mild TTP of frontal sinuses. Suspect viral etiology at present.  Increase fluids.  Rest. Saline nasal spray.  Resume allergy medications.  Place humidifier in bedroom.  Rx for z-pack printed and handed to patient to begin taking if sinus pain worsens or is not improving over the weekend.

## 2013-12-01 NOTE — Assessment & Plan Note (Signed)
Rapid strep negative.  Physical exam unremarkable except for mild TTP of frontal sinuses. Suspect viral etiology at present.  Increase fluids.  Rest. Saline nasal spray.  Resume allergy medications.  Place humidifier in bedroom.  Rx for z-pack printed and handed to patient to begin taking if sinus pain worsens or is not improving over the weekend.

## 2013-12-01 NOTE — Patient Instructions (Signed)
Increase your fluid intake.  Rest.  Use Flonase and saline nasal spray as directed.  Mucinex for cough/congestion.  Place a humidifier in the bedroom.  If symptoms worsen over the weekend, please fill the Azithromycin and begin taking as directed.  If symptoms do not improve, call or return to clinic.  Viral Infections A virus is a type of germ. Viruses can cause:  Minor sore throats.  Aches and pains.  Headaches.  Runny nose.  Rashes.  Watery eyes.  Tiredness.  Coughs.  Loss of appetite.  Feeling sick to your stomach (nausea).  Throwing up (vomiting).  Watery poop (diarrhea). HOME CARE   Only take medicines as told by your doctor.  Drink enough water and fluids to keep your pee (urine) clear or pale yellow. Sports drinks are a good choice.  Get plenty of rest and eat healthy. Soups and broths with crackers or rice are fine. GET HELP RIGHT AWAY IF:   You have a very bad headache.  You have shortness of breath.  You have chest pain or neck pain.  You have an unusual rash.  You cannot stop throwing up.  You have watery poop that does not stop.  You cannot keep fluids down.  You or your child has a temperature by mouth above 102 F (38.9 C), not controlled by medicine.  Your baby is older than 3 months with a rectal temperature of 102 F (38.9 C) or higher.  Your baby is 34 months old or younger with a rectal temperature of 100.4 F (38 C) or higher. MAKE SURE YOU:   Understand these instructions.  Will watch this condition.  Will get help right away if you are not doing well or get worse. Document Released: 06/11/2008 Document Revised: 09/21/2011 Document Reviewed: 11/04/2010 [facility] Patient Information 2014 [place], [place].

## 2013-12-05 ENCOUNTER — Telehealth: Payer: Self-pay | Admitting: Family Medicine

## 2013-12-05 MED ORDER — GUAIFENESIN-CODEINE 100-10 MG/5ML PO SYRP: 5.0000 mL | ORAL_SOLUTION | Freq: Three times a day (TID) | ORAL | Status: DC | PRN

## 2013-12-05 NOTE — Telephone Encounter (Signed)
OK to prescribe in guiafenasin with codeine liquid 1 tsp po tidprn cough, disp #140 ml

## 2013-12-05 NOTE — Telephone Encounter (Signed)
Patient also states she is not getting anymore than 2-3 hours of sleep do to cough

## 2013-12-05 NOTE — Telephone Encounter (Signed)
Patient saw Einar Pheasant last week and states that she now has a cough and would like to know if a cough syrup with codeine could be called in?

## 2014-01-02 ENCOUNTER — Encounter: Payer: Self-pay | Admitting: Certified Nurse Midwife

## 2014-01-02 ENCOUNTER — Ambulatory Visit (INDEPENDENT_AMBULATORY_CARE_PROVIDER_SITE_OTHER): Payer: 59 | Admitting: Certified Nurse Midwife

## 2014-01-02 VITALS — BP 104/64 | HR 68 | Temp 98.4°F | Resp 16 | Ht 64.0 in | Wt 142.0 lb

## 2014-01-02 DIAGNOSIS — B3731 Acute candidiasis of vulva and vagina: Secondary | ICD-10-CM

## 2014-01-02 DIAGNOSIS — N390 Urinary tract infection, site not specified: Secondary | ICD-10-CM

## 2014-01-02 DIAGNOSIS — B373 Candidiasis of vulva and vagina: Secondary | ICD-10-CM

## 2014-01-02 LAB — POCT URINALYSIS DIPSTICK
BILIRUBIN UA: NEGATIVE
Glucose, UA: NEGATIVE
Ketones, UA: NEGATIVE
Leukocytes, UA: NEGATIVE
Nitrite, UA: NEGATIVE
PH UA: 5
Protein, UA: NEGATIVE
RBC UA: NEGATIVE
Urobilinogen, UA: NEGATIVE

## 2014-01-02 MED ORDER — NITROFURANTOIN MONOHYD MACRO 100 MG PO CAPS: 100.0000 mg | ORAL_CAPSULE | Freq: Two times a day (BID) | ORAL | Status: DC

## 2014-01-02 MED ORDER — FLUCONAZOLE 150 MG PO TABS: ORAL_TABLET | ORAL | Status: DC

## 2014-01-02 NOTE — Progress Notes (Signed)
53 y.o. married white female [id number] here with complaint of vaginal symptoms of itching, burning, and increase discharge. Describes discharge as white, mucous with blood tinge.Patient treated with Monistat 14 days ago. Paitent also complaining of vaginal soreness, and discomfort after sexual activity. Onset of symptoms 7 days ago. Denies new personal products. Patient does have some  vaginal dryness prior to period. She also relates she had left a tampon for ? Days after last period. Patient also works out gym and stays in gym clothes prolonged periods of time No STD concerns. Urinary symptoms of frequency and urgency and low back pain for the past two days.Denies fever or chills. Marland Kitchen   O:Healthy female WDWN Affect: normal, orientation x 3  Exam: Abdomen:tender Lymph node: no enlargement or tenderness Pelvic exam: External genital:  BUS: negative Vagina: discharge noted. Ph:   ,Wet prep taken Cervix: normal, non tender Uterus: normal, non tender Adnexa:normal, non tender, no masses or fullness noted   Wet Prep results: Positive for yeast   A: Yeast vaginitis ?UTI symptomatic   P:Discussed findings of yeast vaginitis and etiology. Discussed Aveeno or baking soda sitz bath for comfort. Avoid moist clothes or pads for extended period of time. If working out in gym clothes or swim suits for long periods of time change underwear or bottoms of swimsuit if possible. Olive Oil use for skin protection prior to activity can be used to external skin. Also discussed exam consistent with UTI symptoms, but could be related to yeast and sexual activity also. Patient to treat with Diflucan first and see if the symptoms decrease, while waiting on labs. If symptoms persist or increase will start medication ASAP. Aware of warning signs with UTI. Rx: Macrobid see order with instructions Rx Diflucan see order  Labs: Urine micro/culture  Rv prn

## 2014-01-02 NOTE — Patient Instructions (Signed)
Monilial Vaginitis Vaginitis in a soreness, swelling and redness (inflammation) of the vagina and vulva. Monilial vaginitis is not a sexually transmitted infection. CAUSES  Yeast vaginitis is caused by yeast (candida) that is normally found in your vagina. With a yeast infection, the candida has overgrown in number to a point that upsets the chemical balance. SYMPTOMS   White, thick vaginal discharge.  Swelling, itching, redness and irritation of the vagina and possibly the lips of the vagina (vulva).  Burning or painful urination.  Painful intercourse. DIAGNOSIS  Things that may contribute to monilial vaginitis are:  Postmenopausal and virginal states.  Pregnancy.  Infections.  Being tired, sick or stressed, especially if you had monilial vaginitis in the past.  Diabetes. Good control will help lower the chance.  Birth control pills.  Tight fitting garments.  Using bubble bath, feminine sprays, douches or deodorant tampons.  Taking certain medications that kill germs (antibiotics).  Sporadic recurrence can occur if you become ill. TREATMENT  Your caregiver will give you medication.  There are several kinds of anti monilial vaginal creams and suppositories specific for monilial vaginitis. For recurrent yeast infections, use a suppository or cream in the vagina 2 times a week, or as directed.  Anti-monilial or steroid cream for the itching or irritation of the vulva may also be used. Get your caregiver's permission.  Painting the vagina with methylene blue solution may help if the monilial cream does not work.  Eating yogurt may help prevent monilial vaginitis. HOME CARE INSTRUCTIONS   Finish all medication as prescribed.  Do not have sex until treatment is completed or after your caregiver tells you it is okay.  Take warm sitz baths.  Do not douche.  Do not use tampons, especially scented ones.  Wear cotton underwear.  Avoid tight pants and panty  hose.  Tell your sexual partner that you have a yeast infection. They should go to their caregiver if they have symptoms such as mild rash or itching.  Your sexual partner should be treated as well if your infection is difficult to eliminate.  Practice safer sex. Use condoms.  Some vaginal medications cause latex condoms to fail. Vaginal medications that harm condoms are:  Cleocin cream.  Butoconazole (Femstat).  Terconazole (Terazol) vaginal suppository.  Miconazole (Monistat) (may be purchased over the counter). SEEK MEDICAL CARE IF:   You have a temperature by mouth above 102 F (38.9 C).  The infection is getting worse after 2 days of treatment.  The infection is not getting better after 3 days of treatment.  You develop blisters in or around your vagina.  You develop vaginal bleeding, and it is not your menstrual period.  You have pain when you urinate.  You develop intestinal problems.  You have pain with sexual intercourse. Document Released: 04/08/2005 Document Revised: 09/21/2011 Document Reviewed: 12/21/2008 Exit[employer] Patient Information 2015 [employer]. This information is not intended to replace advice given to you by your health care provider. Make sure you discuss any questions you have with your health care provider. Urinary Tract Infection Urinary tract infections (UTIs) can develop anywhere along your urinary tract. Your urinary tract is your body's drainage system for removing wastes and extra water. Your urinary tract includes two kidneys, two ureters, a bladder, and a urethra. Your kidneys are a pair of bean-shaped organs. Each kidney is about the size of your fist. They are located below your ribs, one on each side of your spine. CAUSES Infections are caused by microbes, which are   microscopic organisms, including fungi, viruses, and bacteria. These organisms are so small that they can only be seen through a microscope. Bacteria are the microbes that  most commonly cause UTIs. SYMPTOMS  Symptoms of UTIs may vary by age and gender of the patient and by the location of the infection. Symptoms in young women typically include a frequent and intense urge to urinate and a painful, burning feeling in the bladder or urethra during urination. Older women and men are more likely to be tired, shaky, and weak and have muscle aches and abdominal pain. A fever may mean the infection is in your kidneys. Other symptoms of a kidney infection include pain in your back or sides below the ribs, nausea, and vomiting. DIAGNOSIS To diagnose a UTI, your caregiver will ask you about your symptoms. Your caregiver also will ask to provide a urine sample. The urine sample will be tested for bacteria and white blood cells. White blood cells are made by your body to help fight infection. TREATMENT  Typically, UTIs can be treated with medication. Because most UTIs are caused by a bacterial infection, they usually can be treated with the use of antibiotics. The choice of antibiotic and length of treatment depend on your symptoms and the type of bacteria causing your infection. HOME CARE INSTRUCTIONS  If you were prescribed antibiotics, take them exactly as your caregiver instructs you. Finish the medication even if you feel better after you have only taken some of the medication.  Drink enough water and fluids to keep your urine clear or pale yellow.  Avoid caffeine, tea, and carbonated beverages. They tend to irritate your bladder.  Empty your bladder often. Avoid holding urine for long periods of time.  Empty your bladder before and after sexual intercourse.  After a bowel movement, women should cleanse from front to back. Use each tissue only once. SEEK MEDICAL CARE IF:   You have back pain.  You develop a fever.  Your symptoms do not begin to resolve within 3 days. SEEK IMMEDIATE MEDICAL CARE IF:   You have severe back pain or lower abdominal pain.  You  develop chills.  You have nausea or vomiting.  You have continued burning or discomfort with urination. MAKE SURE YOU:   Understand these instructions.  Will watch your condition.  Will get help right away if you are not doing well or get worse. Document Released: 04/08/2005 Document Revised: 12/29/2011 Document Reviewed: 08/07/2011 ExitCare Patient Information 2015 [employer]. This information is not intended to replace advice given to you by your health care provider. Make sure you discuss any questions you have with your health care provider.  

## 2014-01-03 LAB — URINE CULTURE
Colony Count: NO GROWTH
Organism ID, Bacteria: NO GROWTH

## 2014-01-08 NOTE — Progress Notes (Signed)
Reviewed personally.  M. Suzanne Miller, MD.  

## 2014-04-09 ENCOUNTER — Telehealth: Payer: Self-pay | Admitting: Family Medicine

## 2014-04-09 NOTE — Telephone Encounter (Signed)
Is this for oral lesions? valcyclovir what strength and frequency has been working for her? Can rx Magic Mouthwash 10 cc swish and spit qid prn pain disp #1 bottle with 2 rf

## 2014-04-09 NOTE — Telephone Encounter (Signed)
Please advise 

## 2014-04-09 NOTE — Telephone Encounter (Signed)
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   Can we refill these?

## 2014-04-10 NOTE — Telephone Encounter (Signed)
Pt states it is for oral lesions but she has the bottle at home. Pt will call tomorrow and leave the information with the front staff.  Pt would like for [place] not to send in the magic mouthwash until the Valtrex is called in. I informed pt that Dr Charlett Blake and I are off on Wed so this would be sent in on Thursday and she stated that was ok.  [place]

## 2014-04-11 NOTE — Telephone Encounter (Signed)
Patient wanted to make you aware of the concentration1gram of Valacyclovir HCL oral

## 2014-04-12 NOTE — Telephone Encounter (Signed)
Received the dosage of valtrex below. Please advise how many to send to pharmacy

## 2014-04-12 NOTE — Telephone Encounter (Signed)
Valtrex 1 gm tabs 2 tabs po bid x 24 for oral sores. Disp #4 with 1 rf

## 2014-04-13 MED ORDER — VALACYCLOVIR HCL 1 G PO TABS: 1000.0000 mg | ORAL_TABLET | Freq: Two times a day (BID) | ORAL | Status: DC

## 2014-04-13 MED ORDER — MAGIC MOUTHWASH: 10.0000 mL | Freq: Four times a day (QID) | ORAL | Status: DC | PRN

## 2014-04-13 NOTE — Telephone Encounter (Signed)
rx sent

## 2014-05-14 ENCOUNTER — Encounter: Payer: Self-pay | Admitting: Certified Nurse Midwife

## 2014-09-03 ENCOUNTER — Other Ambulatory Visit: Payer: Self-pay

## 2014-09-03 DIAGNOSIS — Z1231 Encounter for screening mammogram for malignant neoplasm of breast: Secondary | ICD-10-CM

## 2014-09-03 DIAGNOSIS — Z803 Family history of malignant neoplasm of breast: Secondary | ICD-10-CM

## 2014-09-20 ENCOUNTER — Ambulatory Visit (INDEPENDENT_AMBULATORY_CARE_PROVIDER_SITE_OTHER): Payer: 59 | Admitting: Certified Nurse Midwife

## 2014-09-20 ENCOUNTER — Encounter: Payer: Self-pay | Admitting: Certified Nurse Midwife

## 2014-09-20 VITALS — BP 100/60 | HR 68 | Resp 18 | Ht 64.0 in | Wt 144.0 lb

## 2014-09-20 DIAGNOSIS — B009 Herpesviral infection, unspecified: Secondary | ICD-10-CM | POA: Diagnosis not present

## 2014-09-20 DIAGNOSIS — Z0000 Encounter for general adult medical examination without abnormal findings: Secondary | ICD-10-CM | POA: Diagnosis not present

## 2014-09-20 DIAGNOSIS — Z01419 Encounter for gynecological examination (general) (routine) without abnormal findings: Secondary | ICD-10-CM | POA: Diagnosis not present

## 2014-09-20 LAB — POCT URINALYSIS DIPSTICK
Bilirubin, UA: NEGATIVE
Glucose, UA: NEGATIVE
KETONES UA: NEGATIVE
LEUKOCYTES UA: NEGATIVE
Nitrite, UA: NEGATIVE
PH UA: 5
Protein, UA: NEGATIVE
Urobilinogen, UA: NEGATIVE

## 2014-09-20 LAB — LIPID PANEL
CHOL/HDL RATIO: 2.2 ratio
Cholesterol: 193 mg/dL (ref 0–200)
HDL: 87 mg/dL (ref >=46)
LDL CALC: 96 mg/dL (ref 0–99)
TRIGLYCERIDES: 49 mg/dL (ref <150)
VLDL: 10 mg/dL (ref 0–40)

## 2014-09-20 LAB — TSH: TSH: 2.136 u[IU]/mL (ref 0.350–4.500)

## 2014-09-20 LAB — CBC
HCT: 43.9 % (ref 36.0–46.0)
Hemoglobin: 14.3 g/dL (ref 12.0–15.0)
MCH: 30.4 pg (ref 26.0–34.0)
MCHC: 32.6 g/dL (ref 30.0–36.0)
MCV: 93.2 fL (ref 78.0–100.0)
MPV: 9 fL (ref 8.6–12.4)
PLATELETS: 271 10*3/uL (ref 150–400)
RBC: 4.71 MIL/uL (ref 3.87–5.11)
RDW: 13.7 % (ref 11.5–15.5)
WBC: 4 10*3/uL (ref 4.0–10.5)

## 2014-09-20 LAB — HEMOGLOBIN, FINGERSTICK: HEMOGLOBIN, FINGERSTICK: 13.9 g/dL (ref 12.0–16.0)

## 2014-09-20 MED ORDER — VALACYCLOVIR HCL 1 G PO TABS: 1000.0000 mg | ORAL_TABLET | Freq: Two times a day (BID) | ORAL | Status: DC

## 2014-09-20 NOTE — Progress Notes (Signed)
54 y.o. [id number] Married  Caucasian Fe here for annual exam. Periods changing every 2 months until 9/15 to 10/15. Had period in 12/15, 2/15, and just slight spotting with 09/15/14 period. Patient has had increased migraine with starting trying OTC Estroven, then stopped now using Progesterone cream with no migraines and feels so much better. Using Yam progesterone very small amount daily. No breast tenderness or any other symptoms. Now has joined the fitness center and has now using standing desk with no more hip pain. Denies hot flashes, some night sweats, no vaginal dryness. See PCP prn. Fasting labs requested for work. Starting with Epic at work.  Had colonoscopy last year all negative. No other health issues.  Patient's last menstrual period was 09/15/2014.          Sexually active: Yes.    The current method of family planning is vasectomy.    Exercising: Yes.    Walking, Jogging, yoga Smoker:  no  Health Maintenance: Pap: 09/18/13 neg MMG:  10/10/13 BIRADS1:neg Colonoscopy:  09/2012 Normal - repeat in 10 years  BMD:  Never TDaP: 2012 Labs: here today  UA:tr. RBC Hg:13.9   reports that she quit smoking about 5 years ago. Her smoking use included Cigarettes. She has never used smokeless tobacco. She reports that she drinks about 2.0 oz of alcohol per week. She reports that she does not use illicit drugs.  Past Medical History  Diagnosis Date  . Vitamin D deficiency 2-12  . Allergy     seasonal  . Bursitis of hip 1/11    left  . Hip pain, left 12/24/2010  . Constipation 12/24/2010  . Preventative health care 12/24/2010  . Recurrent oral ulcers 07/22/2011  . Lipoma of arm 02/14/2013  . HSV-1 (herpes simplex virus 1) infection   . HPV test positive   . Fracture of arm 2004  . Mononucleosis   . Abnormal uterine bleeding   . Abnormal Pap smear of cervix 1996  . Anxiety     past    Past Surgical History  Procedure Laterality Date  . Appendectomy  55 yrs old  . Intrauterine device insertion   09/24/1997    Paraguard  . Cervical biopsy  w/ loop electrode excision  1996    LGSIL    Current Outpatient Prescriptions  Medication Sig Dispense Refill  . Alum & Mag Hydroxide-Simeth (MAGIC MOUTHWASH) SOLN Take 10 mLs by mouth 4 (four) times daily as needed for mouth pain. 15 mL 0  . aspirin-acetaminophen-caffeine (EXCEDRIN MIGRAINE) 834-196-22 MG per tablet Take by mouth every 6 (six) hours as needed for headache.    Marland Kitchen CALCIUM PO Take by mouth daily.    . Multiple Vitamins-Minerals (MULTIVITAMIN PO) Take by mouth daily.    . valACYclovir (VALTREX) 1000 MG tablet Take 1 tablet (1,000 mg total) by mouth 2 (two) times daily. X 2 days 4 tablet 1   No current facility-administered medications for this visit.    Family History  Problem Relation Age of Onset  . Cancer Mother 21    breast- now bones and brain  . Hypertension Mother   . Heart disease Mother   . Breast cancer Mother   . Cancer Father 62    prostate  . Heart disease Maternal Grandfather   . Stroke Paternal Grandfather     multiple mini strokes  . Tuberculosis Paternal Grandfather   . Colon cancer Neg Hx   . Osteoporosis Paternal Grandmother     ROS:  Pertinent items are  noted in HPI.  Otherwise, a comprehensive ROS was negative.  Exam:   BP 100/60 mmHg  Pulse 68  Resp 18  Ht 5\' 4"  (1.626 m)  Wt 144 lb (65.318 kg)  BMI 24.71 kg/m2  LMP 09/15/2014 Height: 5\' 4"  (162.6 cm) Ht Readings from Last 3 Encounters:  09/20/14 5\' 4"  (1.626 m)  01/02/14 5\' 4"  (1.626 m)  12/01/13 5\' 4"  (1.626 m)    General appearance: alert, cooperative and appears stated age Head: Normocephalic, without obvious abnormality, atraumatic Neck: no adenopathy, supple, symmetrical, trachea midline and thyroid normal to inspection and palpation Lungs: clear to auscultation bilaterally Breasts: normal appearance, no masses or tenderness, No nipple retraction or dimpling, No nipple discharge or bleeding, No axillary or supraclavicular  adenopathy Heart: regular rate and rhythm Abdomen: soft, non-tender; no masses,  no organomegaly Extremities: extremities normal, atraumatic, no cyanosis or edema Skin: Skin color, texture, turgor normal. No rashes or lesions Lymph nodes: Cervical, supraclavicular, and axillary nodes normal. No abnormal inguinal nodes palpated Neurologic: Grossly normal   Pelvic: External genitalia:  no lesions              Urethra:  normal appearing urethra with no masses, tenderness or lesions              Bartholin's and Skene's: normal                 Vagina: normal appearing vagina with normal color and discharge, no lesions, scant blood noted from cervix              Cervix: normal, non tender, no lesion scant blood noted              Pap taken: No. Bimanual Exam:  Uterus:  normal size, contour, position, consistency, mobility, non-tender              Adnexa: normal adnexa and no mass, fullness, tenderness               Rectovaginal: Confirms               Anus:  normal sphincter tone, no lesions  Chaperone present: Yes  A:  Well Woman with normal exam  Perimenopausal with cycle changes  Oral HSV needs Valtrex refill  Fasting labs  P:   Reviewed health and wellness pertinent to exam  Discussed etiology and bleeding profile expectations. Aware she needs to notify if no menses in 3 months. Discussed not using estrogen supplement which will trigger migraines.  Minimal progesterone acceptable is she  Reports any changes or breast changes.  Rx Valtrex see order  Labs: TSH,CBC,Lipid Panel Vitamin D  Pap smear not  taken today,  counseled on breast self exam, mammography screening, menopause, adequate intake of calcium and vitamin D, diet and exercise, Kegel's exercises  return annually or prn  An After Visit Summary was printed and given to the patient.

## 2014-09-20 NOTE — Patient Instructions (Signed)
EXERCISE AND DIET:  We recommended that you start or continue a regular exercise program for good health. Regular exercise means any activity that makes your heart beat faster and makes you sweat.  We recommend exercising at least 30 minutes per day at least 3 days a week, preferably 4 or 5.  We also recommend a diet low in fat and sugar.  Inactivity, poor dietary choices and obesity can cause diabetes, heart attack, stroke, and kidney damage, among others.    ALCOHOL AND SMOKING:  Women should limit their alcohol intake to no more than 7 drinks/beers/glasses of wine (combined, not each!) per week. Moderation of alcohol intake to this level decreases your risk of breast cancer and liver damage. And of course, no recreational drugs are part of a healthy lifestyle.  And absolutely no smoking or even second hand smoke. Most people know smoking can cause heart and lung diseases, but did you know it also contributes to weakening of your bones? Aging of your skin?  Yellowing of your teeth and nails?  CALCIUM AND VITAMIN D:  Adequate intake of calcium and Vitamin D are recommended.  The recommendations for exact amounts of these supplements seem to change often, but generally speaking 600 mg of calcium (either carbonate or citrate) and 800 units of Vitamin D per day seems prudent. Certain women may benefit from higher intake of Vitamin D.  If you are among these women, your doctor will have told you during your visit.    PAP SMEARS:  Pap smears, to check for cervical cancer or precancers,  have traditionally been done yearly, although recent scientific advances have shown that most women can have pap smears less often.  However, every woman still should have a physical exam from her gynecologist every year. It will include a breast check, inspection of the vulva and vagina to check for abnormal growths or skin changes, a visual exam of the cervix, and then an exam to evaluate the size and shape of the uterus and  ovaries.  And after 53 years of age, a rectal exam is indicated to check for rectal cancers. We will also provide age appropriate advice regarding health maintenance, like when you should have certain vaccines, screening for sexually transmitted diseases, bone density testing, colonoscopy, mammograms, etc.   MAMMOGRAMS:  All women over 40 years old should have a yearly mammogram. Many facilities now offer a "3D" mammogram, which may cost around $50 extra out of pocket. If possible,  we recommend you accept the option to have the 3D mammogram performed.  It both reduces the number of women who will be called back for extra views which then turn out to be normal, and it is better than the routine mammogram at detecting truly abnormal areas.    COLONOSCOPY:  Colonoscopy to screen for colon cancer is recommended for all women at age 50.  We know, you hate the idea of the prep.  We agree, BUT, having colon cancer and not knowing it is worse!!  Colon cancer so often starts as a polyp that can be seen and removed at colonscopy, which can quite literally save your life!  And if your first colonoscopy is normal and you have no family history of colon cancer, most women don't have to have it again for 10 years.  Once every ten years, you can do something that may end up saving your life, right?  We will be happy to help you get it scheduled when you are ready.    Be sure to check your insurance coverage so you understand how much it will cost.  It may be covered as a preventative service at no cost, but you should check your particular policy.     Perimenopause Perimenopause is the time when your body begins to move into the menopause (no menstrual period for 12 straight months). It is a natural process. Perimenopause can begin 2-8 years before the menopause and usually lasts for 1 year after the menopause. During this time, your ovaries may or may not produce an egg. The ovaries vary in their production of estrogen and  progesterone hormones each month. This can cause irregular menstrual periods, difficulty getting pregnant, vaginal bleeding between periods, and uncomfortable symptoms. CAUSES  Irregular production of the ovarian hormones, estrogen and progesterone, and not ovulating every month.  Other causes include:  Tumor of the pituitary gland in the brain.  Medical disease that affects the ovaries.  Radiation treatment.  Chemotherapy.  Unknown causes.  Heavy smoking and excessive alcohol intake can bring on perimenopause sooner. SIGNS AND SYMPTOMS   Hot flashes.  Night sweats.  Irregular menstrual periods.  Decreased sex drive.  Vaginal dryness.  Headaches.  Mood swings.  Depression.  Memory problems.  Irritability.  Tiredness.  Weight gain.  Trouble getting pregnant.  The beginning of losing bone cells (osteoporosis).  The beginning of hardening of the arteries (atherosclerosis). DIAGNOSIS  Your health care provider will make a diagnosis by analyzing your age, menstrual history, and symptoms. He or she will do a physical exam and note any changes in your body, especially your female organs. Female hormone tests may or may not be helpful depending on the amount of female hormones you produce and when you produce them. However, other hormone tests may be helpful to rule out other problems. TREATMENT  In some cases, no treatment is needed. The decision on whether treatment is necessary during the perimenopause should be made by you and your health care provider based on how the symptoms are affecting you and your lifestyle. Various treatments are available, such as:  Treating individual symptoms with a specific medicine for that symptom.  Herbal medicines that can help specific symptoms.  Counseling.  Group therapy. HOME CARE INSTRUCTIONS   Keep track of your menstrual periods (when they occur, how heavy they are, how long between periods, and how long they last) as  well as your symptoms and when they started.  Only take over-the-counter or prescription medicines as directed by your health care provider.  Sleep and rest.  Exercise.  Eat a diet that contains calcium (good for your bones) and soy (acts like the estrogen hormone).  Do not smoke.  Avoid alcoholic beverages.  Take vitamin supplements as recommended by your health care provider. Taking vitamin E may help in certain cases.  Take calcium and vitamin D supplements to help prevent bone loss.  Group therapy is sometimes helpful.  Acupuncture may help in some cases. SEEK MEDICAL CARE IF:   You have questions about any symptoms you are having.  You need a referral to a specialist (gynecologist, psychiatrist, or psychologist). SEEK IMMEDIATE MEDICAL CARE IF:   You have vaginal bleeding.  Your period lasts longer than 8 days.  Your periods are recurring sooner than 21 days.  You have bleeding after intercourse.  You have severe depression.  You have pain when you urinate.  You have severe headaches.  You have vision problems. Document Released: 08/06/2004 Document Revised: 04/19/2013 Document Reviewed: 01/26/2013 Exit[employer]  Patient Information 2015 [employer]. This information is not intended to replace advice given to you by your health care provider. Make sure you discuss any questions you have with your health care provider.  

## 2014-09-20 NOTE — Progress Notes (Signed)
Reviewed personally.  M. Suzanne Saathvik Every, MD.  

## 2014-09-21 LAB — VITAMIN D 25 HYDROXY (VIT D DEFICIENCY, FRACTURES): Vit D, 25-Hydroxy: 23 ng/mL — ABNORMAL LOW (ref 30–100)

## 2014-10-18 ENCOUNTER — Ambulatory Visit: Payer: Self-pay

## 2014-10-26 ENCOUNTER — Ambulatory Visit
Admission: RE | Admit: 2014-10-26 | Discharge: 2014-10-26 | Disposition: A | Discharge status: Home / Self Care | Payer: 59 | Source: Ambulatory Visit

## 2014-10-26 DIAGNOSIS — Z1231 Encounter for screening mammogram for malignant neoplasm of breast: Secondary | ICD-10-CM

## 2014-10-26 DIAGNOSIS — Z803 Family history of malignant neoplasm of breast: Secondary | ICD-10-CM

## 2015-10-02 ENCOUNTER — Other Ambulatory Visit: Payer: Self-pay

## 2015-10-02 DIAGNOSIS — Z1231 Encounter for screening mammogram for malignant neoplasm of breast: Secondary | ICD-10-CM

## 2015-10-04 ENCOUNTER — Other Ambulatory Visit: Payer: Self-pay | Admitting: Certified Nurse Midwife

## 2015-10-04 ENCOUNTER — Ambulatory Visit (INDEPENDENT_AMBULATORY_CARE_PROVIDER_SITE_OTHER): Payer: Commercial Managed Care - PPO | Admitting: Certified Nurse Midwife

## 2015-10-04 ENCOUNTER — Encounter: Payer: Self-pay | Admitting: Certified Nurse Midwife

## 2015-10-04 VITALS — BP 106/62 | HR 68 | Resp 16 | Ht 63.75 in | Wt 148.0 lb

## 2015-10-04 DIAGNOSIS — N951 Menopausal and female climacteric states: Secondary | ICD-10-CM

## 2015-10-04 DIAGNOSIS — Z124 Encounter for screening for malignant neoplasm of cervix: Secondary | ICD-10-CM | POA: Diagnosis not present

## 2015-10-04 DIAGNOSIS — Z0000 Encounter for general adult medical examination without abnormal findings: Secondary | ICD-10-CM

## 2015-10-04 DIAGNOSIS — Z01419 Encounter for gynecological examination (general) (routine) without abnormal findings: Secondary | ICD-10-CM | POA: Diagnosis not present

## 2015-10-04 LAB — COMPREHENSIVE METABOLIC PANEL
ALBUMIN: 4.1 g/dL (ref 3.6–5.1)
ALK PHOS: 53 U/L (ref 33–130)
ALT: 17 U/L (ref 6–29)
AST: 19 U/L (ref 10–35)
BUN: 16 mg/dL (ref 7–25)
CHLORIDE: 103 mmol/L (ref 98–110)
CO2: 26 mmol/L (ref 20–31)
Calcium: 9.3 mg/dL (ref 8.6–10.4)
Creat: 0.61 mg/dL (ref 0.50–1.05)
Glucose, Bld: 76 mg/dL (ref 65–99)
POTASSIUM: 4.6 mmol/L (ref 3.5–5.3)
Sodium: 136 mmol/L (ref 135–146)
TOTAL PROTEIN: 6.7 g/dL (ref 6.1–8.1)
Total Bilirubin: 0.5 mg/dL (ref 0.2–1.2)

## 2015-10-04 LAB — FOLLICLE STIMULATING HORMONE: FSH: 15.8 m[IU]/mL

## 2015-10-04 LAB — TSH: TSH: 2.13 m[IU]/L

## 2015-10-04 LAB — LIPID PANEL
CHOLESTEROL: 194 mg/dL (ref 125–200)
HDL: 99 mg/dL (ref >=46)
Total CHOL/HDL Ratio: 2 Ratio (ref <=5.0)

## 2015-10-04 LAB — CBC
HCT: 43.7 % (ref 36.0–46.0)
Hemoglobin: 14.3 g/dL (ref 12.0–15.0)
MCH: 30.5 pg (ref 26.0–34.0)
MCHC: 32.7 g/dL (ref 30.0–36.0)
MCV: 93.2 fL (ref 78.0–100.0)
MPV: 9.3 fL (ref 8.6–12.4)
PLATELETS: 313 10*3/uL (ref 150–400)
RBC: 4.69 MIL/uL (ref 3.87–5.11)
RDW: 13.2 % (ref 11.5–15.5)
WBC: 5.9 10*3/uL (ref 4.0–10.5)

## 2015-10-04 NOTE — Patient Instructions (Signed)

## 2015-10-04 NOTE — Progress Notes (Signed)
55 y.o. [id number] Married  Caucasian Fe here for annual exam. Hot flashes and night sweats have subsided. Periods stopped on 11/12/14. Denies any bleeding or  vaginal dryness. Sees Dr. Randel Pigg for aex/labs, but not this year. Desires screening labs here today.Social stress with mother in Hospice care now with progressive brain cancer. Very supportive friends and family. Maybe relocating to [facility]. No health concerns today.   Patient's last menstrual period was 11/12/2014.          Sexually active: Yes.    The current method of family planning is vasectomy.    Exercising: Yes.    yoga & walking Smoker:  no  Health Maintenance: Pap:  09-18-13 neg MMG:  10-26-14 category c density,birads 1:neg Colonoscopy:  2014 f/u 19yrs BMD:   none TDaP:  2012 Shingles: no Pneumonia: no Hep C and HIV: not done Labs: none Self breast exam: pt aware of them   reports that she quit smoking about 6 years ago. Her smoking use included Cigarettes. She has never used smokeless tobacco. She reports that she drinks about 4.2 - 4.8 oz of alcohol per week. She reports that she does not use illicit drugs.  Past Medical History  Diagnosis Date  . Vitamin D deficiency 2-12  . Allergy     seasonal  . Bursitis of hip 1/11    left  . Hip pain, left 12/24/2010  . Constipation 12/24/2010  . Preventative health care 12/24/2010  . Recurrent oral ulcers 07/22/2011  . Lipoma of arm 02/14/2013  . HSV-1 (herpes simplex virus 1) infection   . HPV test positive   . Fracture of arm 2004  . Mononucleosis   . Abnormal uterine bleeding   . Abnormal Pap smear of cervix 1996  . Anxiety     past    Past Surgical History  Procedure Laterality Date  . Appendectomy  55 yrs old  . Intrauterine device insertion  09/24/1997    Paraguard  . Cervical biopsy  w/ loop electrode excision  1996    LGSIL    Current Outpatient Prescriptions  Medication Sig Dispense Refill  . Alum & Mag Hydroxide-Simeth (MAGIC MOUTHWASH) SOLN Take 10  mLs by mouth 4 (four) times daily as needed for mouth pain. 15 mL 0  . aspirin-acetaminophen-caffeine (EXCEDRIN MIGRAINE) [id number] MG per tablet Take by mouth every 6 (six) hours as needed for headache.    Marland Kitchen CALCIUM PO Take by mouth daily.    . Multiple Vitamins-Minerals (MULTIVITAMIN PO) Take by mouth daily.    . valACYclovir (VALTREX) 1000 MG tablet Take 1 tablet (1,000 mg total) by mouth 2 (two) times daily. X 2 days 30 tablet 1   No current facility-administered medications for this visit.    Family History  Problem Relation Age of Onset  . Cancer Mother 47    breast- now bones and brain  . Hypertension Mother   . Heart disease Mother   . Breast cancer Mother   . Cancer Father 53    prostate  . Heart disease Maternal Grandfather   . Stroke Paternal Grandfather     multiple mini strokes  . Tuberculosis Paternal Grandfather   . Colon cancer Neg Hx   . Osteoporosis Paternal Grandmother     ROS:  Pertinent items are noted in HPI.  Otherwise, a comprehensive ROS was negative.  Exam:   BP 106/62 mmHg  Pulse 68  Resp 16  Ht 5' 3.75" (1.619 m)  Wt 148 lb (67.132 kg)  BMI 25.61 kg/m2  LMP 11/12/2014 Height: 5' 3.75" (161.9 cm) Ht Readings from Last 3 Encounters:  10/04/15 5' 3.75" (1.619 m)  09/20/14 5\' 4"  (1.626 m)  01/02/14 5\' 4"  (1.626 m)    General appearance: alert, cooperative and appears stated age Head: Normocephalic, without obvious abnormality, atraumatic Neck: no adenopathy, supple, symmetrical, trachea midline and thyroid normal to inspection and palpation Lungs: clear to auscultation bilaterally Breasts: normal appearance, no masses or tenderness, No nipple retraction or dimpling, No nipple discharge or bleeding, No axillary or supraclavicular adenopathy Heart: regular rate and rhythm Abdomen: soft, non-tender; no masses,  no organomegaly Extremities: extremities normal, atraumatic, no cyanosis or edema Skin: Skin color, texture, turgor normal. No rashes or  lesions Lymph nodes: Cervical, supraclavicular, and axillary nodes normal. No abnormal inguinal nodes palpated Neurologic: Grossly normal   Pelvic: External genitalia:  no lesions              Urethra:  normal appearing urethra with no masses, tenderness or lesions              Bartholin's and Skene's: normal                 Vagina: normal appearing vagina with normal color and discharge, no lesions              Cervix: normal,non tender,no lesions, stenotic os              Pap taken: Yes.   Bimanual Exam:  Uterus:  normal size, contour, position, consistency, mobility, non-tender              Adnexa: normal adnexa and no mass, fullness, tenderness               Rectovaginal: Confirms               Anus:  normal sphincter tone, no lesions  Chaperone present: yes  A:  Well Woman with normal exam  Menopausal, no menses in almost one year  Screening labs  Family history of breast cancer mother 54 with metastasis to brain  P:   Reviewed health and wellness pertinent to exam  Discussed expectations with menopause and importance to notify if any vaginal bleeding. If vaginal dryness occurs can use coconut oil trial. Questions addressed.  Labs: CMP,Lipid panel, CBC, Vitamin D, TSH, FSH  Patient aware of importance of mammogram yearly, has scheduled  Pap smear as above with HPVHR   counseled on breast self exam, mammography screening, menopause, adequate intake of calcium and vitamin D, diet and exercise  return annually or prn  An After Visit Summary was printed and given to the patient.

## 2015-10-05 LAB — VITAMIN D 25 HYDROXY (VIT D DEFICIENCY, FRACTURES): VIT D 25 HYDROXY: 25 ng/mL — AB (ref 30–100)

## 2015-10-07 LAB — IPS PAP TEST WITH HPV

## 2015-10-07 LAB — PROLACTIN: PROLACTIN: 13.7 ng/mL

## 2015-10-09 ENCOUNTER — Telehealth: Payer: Self-pay

## 2015-10-09 NOTE — Progress Notes (Signed)
Encounter reviewed Natalie Wenberg, MD   

## 2015-10-09 NOTE — Telephone Encounter (Signed)
-----   Message from Regina Eck, CNM sent at 10/09/2015 12:15 PM EDT ----- Notify patient pap smear negative, HPVHR not detected Vitamin D low needs protocol TSH and Prolactin are normal Lipid panel looks great, all normal, cholesterol normal Liver, kidney and glucose profile normal CBC is normal FSH is not showing menopause level at this point. Recommend Provera challenge to make sure no overgrowth of uterine lining. Rx Provera 10 mg x 10 days, needs to advise if vaginal bleeding or no vaginal bleeding up to two weeks after completion of Provera

## 2015-10-09 NOTE — Telephone Encounter (Signed)
lmtcb

## 2015-10-10 ENCOUNTER — Other Ambulatory Visit: Payer: Self-pay | Admitting: Certified Nurse Midwife

## 2015-10-10 ENCOUNTER — Telehealth: Payer: Self-pay

## 2015-10-10 DIAGNOSIS — R899 Unspecified abnormal finding in specimens from other organs, systems and tissues: Secondary | ICD-10-CM

## 2015-10-10 MED ORDER — MEDROXYPROGESTERONE ACETATE 10 MG PO TABS: 10.0000 mg | ORAL_TABLET | Freq: Every day | ORAL | Status: DC

## 2015-10-10 NOTE — Telephone Encounter (Signed)
Patient returning call.

## 2015-10-10 NOTE — Telephone Encounter (Signed)
Patient notified of results. See lab 

## 2015-10-10 NOTE — Telephone Encounter (Signed)
Left message for call back.

## 2015-10-23 ENCOUNTER — Telehealth: Payer: Self-pay | Admitting: Certified Nurse Midwife

## 2015-10-23 NOTE — Telephone Encounter (Signed)
Patient was told to call after taking her prescription to give info to Regina Eck nurse.

## 2015-10-23 NOTE — Telephone Encounter (Signed)
Agree with plan 

## 2015-10-23 NOTE — Telephone Encounter (Signed)
Spoke with patient. Patient's LMP was 11/12/2014. [place] at her OV on 10/04/2015 was 15.8. Patient states that she took Provera 10 mg x 10 days as directed. Took her last dose on 10/19/2015. Started her cycle today 10/23/2015. Patient denies any current heavy bleeding at this time. Advised she will need to monitor for heavy bleeding. If she is having to change her pad/tampon every hour to 1 1/2 she will need to be seen in the office for evaluation. If she has prolonged bleeding past 7 days she will also need to contact the office. Advised she will need to keep a menstrual calendar. If she misses 3 cycles in a row she will need to contact the office. She is agreeable. Advised I will also speak with Melvia Heaps CNM and will return call with any further recommendations. She is agreeable.  Melvia Heaps CNM anything further needed for this patient?

## 2015-10-31 ENCOUNTER — Ambulatory Visit: Payer: 59

## 2015-11-01 ENCOUNTER — Ambulatory Visit
Admission: RE | Admit: 2015-11-01 | Discharge: 2015-11-01 | Disposition: A | Discharge status: Home / Self Care | Payer: Commercial Managed Care - PPO | Source: Ambulatory Visit

## 2015-11-01 DIAGNOSIS — Z1231 Encounter for screening mammogram for malignant neoplasm of breast: Secondary | ICD-10-CM

## 2015-11-11 ENCOUNTER — Other Ambulatory Visit: Payer: Commercial Managed Care - PPO

## 2015-11-14 ENCOUNTER — Other Ambulatory Visit (INDEPENDENT_AMBULATORY_CARE_PROVIDER_SITE_OTHER): Payer: Commercial Managed Care - PPO

## 2015-11-14 DIAGNOSIS — R899 Unspecified abnormal finding in specimens from other organs, systems and tissues: Secondary | ICD-10-CM

## 2015-11-14 LAB — THYROID PANEL WITH TSH
FREE THYROXINE INDEX: 2 (ref 1.4–3.8)
T3 UPTAKE: 33 % (ref 22–35)
T4, Total: 6.1 ug/dL (ref 4.5–12.0)
TSH: 2.33 mIU/L

## 2015-12-20 ENCOUNTER — Telehealth: Payer: Self-pay | Admitting: Certified Nurse Midwife

## 2015-12-20 NOTE — Telephone Encounter (Signed)
Needs to wait due to when bleeding occurred and even though she had slight spotting this also is considering bleeding.  She may want to read about black cohosh for hot flashes to see if she would be a candidate for use. Considered helpful in perimenopause.

## 2015-12-20 NOTE — Telephone Encounter (Signed)
Patient says she is still having irregular cycles while taking the progesterone.

## 2015-12-20 NOTE — Telephone Encounter (Signed)
[place] at her OV on 10/04/2015 was 15.8. Patient states that she took Provera 10 mg x 10 days as directed. Took her last dose on 10/19/2015. Started her cycle on 10/23/2015. Reports her cycle lasted for 4 days and was light spotting. Denies any heavy bleeding. States around May 10-12 she began to have mild cramping and had one small spot of bleeding, but no cycle. Reports she has not had anything in June thus far. States before her cycles stopped she was having hot flashes and migraines. These symptoms stopped when her cycle stopped in 2016. Reports since taking the Provera she has not had migraine, but is having increased hot flashes. Patient is asking if she needs to wait one more month to see if she has any bleeding or if she needs to take Provera again. Would also like for me to remind Melvia Heaps CNM that her mother died of breast cancer which they told her was related to hormones. She wants to be caution with the hormones she states. She is open to "natural" options for hot flashes. Advised I will speak with Melvia Heaps CNM and return call with further recommendations. She is agreeable.

## 2015-12-20 NOTE — Telephone Encounter (Signed)
Spoke with patient. Advised of message as seen below from [place]. She is agreeable and verbalizes understanding.  Routing to provider for final review. Patient agreeable to disposition. Will close encounter.

## 2015-12-21 ENCOUNTER — Telehealth: Payer: Self-pay | Admitting: Obstetrics and Gynecology

## 2015-12-21 NOTE — Telephone Encounter (Signed)
The patient called c/o RLQ abdominal pain x 24 hours, intermittently severe. She is peri-menopausal, had her last light cycle in 4/17 after taking provera. She had her appendix removed in the past. She reports normal bowel and bladder function.  Recommended she go to the ER for evaluation.

## 2016-09-16 ENCOUNTER — Other Ambulatory Visit: Payer: Self-pay | Admitting: Certified Nurse Midwife

## 2016-09-16 DIAGNOSIS — Z1231 Encounter for screening mammogram for malignant neoplasm of breast: Secondary | ICD-10-CM

## 2016-10-07 ENCOUNTER — Ambulatory Visit (INDEPENDENT_AMBULATORY_CARE_PROVIDER_SITE_OTHER): Payer: Commercial Managed Care - PPO | Admitting: Certified Nurse Midwife

## 2016-10-07 ENCOUNTER — Encounter: Payer: Self-pay | Admitting: Certified Nurse Midwife

## 2016-10-07 VITALS — BP 110/66 | HR 70 | Resp 16 | Ht 63.75 in | Wt 142.0 lb

## 2016-10-07 DIAGNOSIS — N951 Menopausal and female climacteric states: Secondary | ICD-10-CM | POA: Diagnosis not present

## 2016-10-07 DIAGNOSIS — Z0000 Encounter for general adult medical examination without abnormal findings: Secondary | ICD-10-CM | POA: Diagnosis not present

## 2016-10-07 DIAGNOSIS — Z01419 Encounter for gynecological examination (general) (routine) without abnormal findings: Secondary | ICD-10-CM | POA: Diagnosis not present

## 2016-10-07 LAB — COMPREHENSIVE METABOLIC PANEL
ALBUMIN: 4.6 g/dL (ref 3.6–5.1)
ALT: 23 U/L (ref 6–29)
AST: 27 U/L (ref 10–35)
Alkaline Phosphatase: 73 U/L (ref 33–130)
BUN: 15 mg/dL (ref 7–25)
CHLORIDE: 105 mmol/L (ref 98–110)
CO2: 27 mmol/L (ref 20–31)
CREATININE: 0.61 mg/dL (ref 0.50–1.05)
Calcium: 9.9 mg/dL (ref 8.6–10.4)
Glucose, Bld: 82 mg/dL (ref 65–99)
POTASSIUM: 4.3 mmol/L (ref 3.5–5.3)
SODIUM: 142 mmol/L (ref 135–146)
TOTAL PROTEIN: 7 g/dL (ref 6.1–8.1)
Total Bilirubin: 0.7 mg/dL (ref 0.2–1.2)

## 2016-10-07 LAB — LIPID PANEL
CHOL/HDL RATIO: 1.9 ratio (ref <5.0)
Cholesterol: 194 mg/dL (ref <200)
HDL: 104 mg/dL (ref >50)
LDL CALC: 79 mg/dL (ref <100)
TRIGLYCERIDES: 54 mg/dL (ref <150)
VLDL: 11 mg/dL (ref <30)

## 2016-10-07 LAB — CBC
HCT: 43.3 % (ref 35.0–45.0)
HEMOGLOBIN: 14 g/dL (ref 11.7–15.5)
MCH: 29.7 pg (ref 27.0–33.0)
MCHC: 32.3 g/dL (ref 32.0–36.0)
MCV: 91.9 fL (ref 80.0–100.0)
MPV: 9.2 fL (ref 7.5–12.5)
Platelets: 286 10*3/uL (ref 140–400)
RBC: 4.71 MIL/uL (ref 3.80–5.10)
RDW: 13.2 % (ref 11.0–15.0)
WBC: 5.1 10*3/uL (ref 3.8–10.8)

## 2016-10-07 NOTE — Progress Notes (Signed)
56 y.o. [id number] Married  Caucasian Fe here for annual exam. Having hot flashes and night sweats, but no issues with tolerating. No sleep issues. No period since Provera use in 01/11/16. Denies spotting. Some vaginal dryness, but coconut oil working well. Sees Urgent Care if needed.  Screening labs as needed today. Now a vegetarian and no mammalian or poultry foods. Spouse diagnosed with a type of mammalian food allergy related to being bitten by [place] tick. Diagn[facility] not  [facility].   No health issues today.  Patient's last menstrual period was 01/11/2016 (approximate).          Sexually active: Yes.    The current method of family planning is vasectomy.    Exercising: Yes.    walking, running, stretching Smoker:  former  Health Maintenance: Pap:  09-18-13 neg, 10-04-15 neg HPV HR neg MMG:  11-01-15 category c density birads 1:neg. Has appt scheduled.  Colonoscopy:  2014 f/u 35yrs BMD: never TDaP:  2012 Shingles: no Pneumonia: no Hep C and HIV: Hep c neg 2014 Labs: Here today Self breast exam: Yes, once a week   reports that she quit smoking about 7 years ago. Her smoking use included Cigarettes. She has never used smokeless tobacco. She reports that she drinks about 4.2 - 4.8 oz of alcohol per week . She reports that she does not use drugs.  Past Medical History:  Diagnosis Date  . Abnormal Pap smear of cervix 1996  . Abnormal uterine bleeding   . Allergy    seasonal  . Anxiety    past  . Bursitis of hip 1/11   left  . Constipation 12/24/2010  . Fracture of arm 2004  . Hip pain, left 12/24/2010  . HPV test positive   . HSV-1 (herpes simplex virus 1) infection   . Lipoma of arm 02/14/2013  . Mononucleosis   . Preventative health care 12/24/2010  . Recurrent oral ulcers 07/22/2011  . Vitamin D deficiency 2-12    Past Surgical History:  Procedure Laterality Date  . APPENDECTOMY  56 yrs old  . CERVICAL BIOPSY  W/ LOOP ELECTRODE EXCISION  1996   LGSIL  .  INTRAUTERINE DEVICE INSERTION  09/24/1997   Paraguard    Current Outpatient Prescriptions  Medication Sig Dispense Refill  . Alum & Mag Hydroxide-Simeth (MAGIC MOUTHWASH) SOLN Take 10 mLs by mouth 4 (four) times daily as needed for mouth pain. 15 mL 0  . aspirin-acetaminophen-caffeine (EXCEDRIN MIGRAINE) 762-831-51 MG per tablet Take by mouth every 6 (six) hours as needed for headache.    Marland Kitchen CALCIUM PO Take by mouth daily.    . Multiple Vitamins-Minerals (MULTIVITAMIN PO) Take by mouth daily.    . valACYclovir (VALTREX) 1000 MG tablet Take 1 tablet (1,000 mg total) by mouth 2 (two) times daily. X 2 days 30 tablet 1   No current facility-administered medications for this visit.     Family History  Problem Relation Age of Onset  . Cancer Mother 74    breast- now bones and brain  . Hypertension Mother   . Heart disease Mother   . Breast cancer Mother   . Cancer Father 12    prostate  . Heart disease Maternal Grandfather   . Stroke Paternal Grandfather     multiple mini strokes  . Tuberculosis Paternal Grandfather   . Osteoporosis Paternal Grandmother   . Colon cancer Neg Hx     ROS:  Pertinent items are noted in HPI.  Otherwise, a comprehensive ROS was negative.  Exam:   BP 110/66 (BP Location: Right Arm, Patient Position: Sitting, Cuff Size: Normal)   Pulse 70   Resp 16   Ht 5' 3.75" (1.619 m)   Wt 142 lb (64.4 kg)   LMP 01/11/2016 (Approximate)   BMI 24.57 kg/m  Height: 5' 3.75" (161.9 cm) Ht Readings from Last 3 Encounters:  10/07/16 5' 3.75" (1.619 m)  10/04/15 5' 3.75" (1.619 m)  09/20/14 5\' 4"  (1.626 m)    General appearance: alert, cooperative and appears stated age Head: Normocephalic, without obvious abnormality, atraumatic Neck: no adenopathy, supple, symmetrical, trachea midline and thyroid normal to inspection and palpation Lungs: clear to auscultation bilaterally Breasts: normal appearance, no masses or tenderness, No nipple retraction or dimpling, No  nipple discharge or bleeding, No axillary or supraclavicular adenopathy Heart: regular rate and rhythm Abdomen: soft, non-tender; no masses,  no organomegaly Extremities: extremities normal, atraumatic, no cyanosis or edema Skin: Skin color, texture, turgor normal. No rashes or lesions Lymph nodes: Cervical, supraclavicular, and axillary nodes normal. No abnormal inguinal nodes palpated Neurologic: Grossly normal   Pelvic: External genitalia:  no lesions              Urethra:  normal appearing urethra with no masses, tenderness or lesions              Bartholin's and Skene's: normal                 Vagina: normal appearing vagina with normal color and discharge, no lesions              Cervix: no cervical motion tenderness and no lesions              Pap taken: No. Bimanual Exam:  Uterus:  normal size, contour, position, consistency, mobility, non-tender              Adnexa: normal adnexa and no mass, fullness, tenderness               Rectovaginal: Confirms               Anus:  normal sphincter tone, no lesions  Chaperone present: yes  A:  Well Woman with normal exam  Perimenopausal with amenorrhea and menopausal symptoms, Last Provera challenge with withdrawal bleeding 4/17, will not take again due to cramping she had with Provera. LMP 7/17  Screening labs  P:   Reviewed health and wellness pertinent to exam  Aware of etiology and expectations of menopause and not menopausal until one year without menses. Aware of concern with hyperplasia, but will not do Provera again. Will do labs today to assess. Patient agreeable. Patient to call if any vaginal bleeding occurs.  Lab: FSH, Prolactin, TSH  Pap smear as above not taken  Labs:CMP, Lipid panel,Vitamin D, CBC   counseled on breast self exam, mammography screening, menopause, adequate intake of calcium and vitamin D, diet and exercise  return annually or prn  An After Visit Summary was printed and given to the patient.

## 2016-10-07 NOTE — Patient Instructions (Signed)

## 2016-10-08 LAB — TSH: TSH: 2.26 mIU/L

## 2016-10-08 LAB — PROLACTIN: PROLACTIN: 10.2 ng/mL

## 2016-10-08 LAB — FOLLICLE STIMULATING HORMONE: FSH: 128.2 m[IU]/mL — ABNORMAL HIGH

## 2016-10-08 LAB — VITAMIN D 25 HYDROXY (VIT D DEFICIENCY, FRACTURES): VIT D 25 HYDROXY: 32 ng/mL (ref 30–100)

## 2016-10-09 NOTE — Progress Notes (Signed)
Encounter reviewed Natalie Virgen, MD   

## 2016-10-29 ENCOUNTER — Encounter: Payer: Self-pay | Admitting: Family Medicine

## 2016-11-02 ENCOUNTER — Ambulatory Visit
Admission: RE | Admit: 2016-11-02 | Discharge: 2016-11-02 | Disposition: A | Discharge status: Home / Self Care | Payer: Commercial Managed Care - PPO | Source: Ambulatory Visit | Attending: Certified Nurse Midwife | Admitting: Certified Nurse Midwife

## 2016-11-02 DIAGNOSIS — Z1231 Encounter for screening mammogram for malignant neoplasm of breast: Secondary | ICD-10-CM

## 2016-11-03 ENCOUNTER — Other Ambulatory Visit: Payer: Self-pay | Admitting: Certified Nurse Midwife

## 2016-11-03 DIAGNOSIS — N6489 Other specified disorders of breast: Secondary | ICD-10-CM

## 2016-11-06 ENCOUNTER — Ambulatory Visit
Admission: RE | Admit: 2016-11-06 | Discharge: 2016-11-06 | Disposition: A | Discharge status: Home / Self Care | Payer: Commercial Managed Care - PPO | Source: Ambulatory Visit | Attending: Certified Nurse Midwife | Admitting: Certified Nurse Midwife

## 2016-11-06 DIAGNOSIS — N6489 Other specified disorders of breast: Secondary | ICD-10-CM

## 2017-09-16 ENCOUNTER — Other Ambulatory Visit: Payer: Self-pay | Admitting: Certified Nurse Midwife

## 2017-09-16 DIAGNOSIS — Z1231 Encounter for screening mammogram for malignant neoplasm of breast: Secondary | ICD-10-CM

## 2017-10-05 DIAGNOSIS — M2021 Hallux rigidus, right foot: Secondary | ICD-10-CM

## 2017-10-05 DIAGNOSIS — M79671 Pain in right foot: Secondary | ICD-10-CM | POA: Insufficient documentation

## 2017-10-05 DIAGNOSIS — M2011 Hallux valgus (acquired), right foot: Secondary | ICD-10-CM | POA: Insufficient documentation

## 2017-10-05 HISTORY — DX: Hallux rigidus, right foot: M20.21

## 2017-10-19 ENCOUNTER — Other Ambulatory Visit: Payer: Self-pay

## 2017-10-19 ENCOUNTER — Ambulatory Visit (INDEPENDENT_AMBULATORY_CARE_PROVIDER_SITE_OTHER): Payer: PRIVATE HEALTH INSURANCE | Admitting: Certified Nurse Midwife

## 2017-10-19 ENCOUNTER — Encounter: Payer: Self-pay | Admitting: Certified Nurse Midwife

## 2017-10-19 VITALS — BP 116/70 | HR 68 | Resp 16 | Ht 63.5 in | Wt 133.0 lb

## 2017-10-19 DIAGNOSIS — N951 Menopausal and female climacteric states: Secondary | ICD-10-CM | POA: Diagnosis not present

## 2017-10-19 DIAGNOSIS — Z01419 Encounter for gynecological examination (general) (routine) without abnormal findings: Secondary | ICD-10-CM

## 2017-10-19 NOTE — Progress Notes (Signed)
57 y.o. G74P0010 Married  Caucasian Fe here for annual exam. Menopausal no HRT. Occasional hot flash or night sweats, no issues with. Denies vaginal bleeding or vaginal dryness. Using coconut oil with good results.  Has changed diet to gluten free, vegetables, no meats except for fish and occasional chicken. Has lost 10 pounds. Currently having bunions issues and saw Podiatrist. Plans to start a walking program when all stable. Mammogram due. History of HSV 1 oral with Valtrex working well. One outbreak only, has refill and declines.update of script. Sees Dr. Randel Pigg prn as needed. Had Flu this year with Urgent care visit for treatment with IV.  No other health issues today. No labs today.  Patient's last menstrual period was 01/11/2016 (approximate).          Sexually active: Yes.    The current method of family planning is post menopausal status & husband vasectomy.    Exercising: Yes.    stretching Smoker:  no  Health Maintenance: Pap:  10-04-15 neg HPV HR neg History of Abnormal Pap: yes MMG:  4/18 bilateral & left breast category c density birads 1:neg Self Breast exams: yes Colonoscopy:  2014 f/u 68yrs BMD:   none TDaP:  2012 Shingles: no Pneumonia: no Hep C and HIV: hep c neg 2014 Labs:  If needed   reports that she quit smoking about 8 years ago. Her smoking use included cigarettes. She has never used smokeless tobacco. She reports that she drinks about 4.2 - 4.8 oz of alcohol per week. She reports that she does not use drugs.  Past Medical History:  Diagnosis Date  . Abnormal Pap smear of cervix 1996  . Abnormal uterine bleeding   . Allergy    seasonal  . Anxiety    past  . Bursitis of hip 1/11   left  . Constipation 12/24/2010  . Fracture of arm 2004  . Hip pain, left 12/24/2010  . HPV test positive   . HSV-1 (herpes simplex virus 1) infection   . Lipoma of arm 02/14/2013  . Mononucleosis   . Preventative health care 12/24/2010  . Recurrent oral ulcers 07/22/2011  . Vitamin  D deficiency 2-12    Past Surgical History:  Procedure Laterality Date  . APPENDECTOMY  57 yrs old  . CERVICAL BIOPSY  W/ LOOP ELECTRODE EXCISION  1996   LGSIL  . INTRAUTERINE DEVICE INSERTION  09/24/1997   Paraguard    Current Outpatient Medications  Medication Sig Dispense Refill  . Alum & Mag Hydroxide-Simeth (MAGIC MOUTHWASH) SOLN Take 10 mLs by mouth 4 (four) times daily as needed for mouth pain. 15 mL 0  . aspirin-acetaminophen-caffeine (EXCEDRIN MIGRAINE) 657-846-96 MG per tablet Take by mouth every 6 (six) hours as needed for headache.    Marland Kitchen CALCIUM PO Take by mouth daily.    . cetirizine (ZYRTEC) 10 MG tablet Take 10 mg by mouth daily.    . Glucosamine HCl (GLUCOSAMINE PO) Take by mouth.    . Multiple Vitamins-Minerals (MULTIVITAMIN PO) Take by mouth daily.    . valACYclovir (VALTREX) 1000 MG tablet Take 1 tablet (1,000 mg total) by mouth 2 (two) times daily. X 2 days (Patient taking differently: Take 1,000 mg by mouth as needed. X 2 days) 30 tablet 1   No current facility-administered medications for this visit.     Family History  Problem Relation Age of Onset  . Cancer Mother 72       breast- now bones and brain  . Hypertension Mother   .  Heart disease Mother   . Breast cancer Mother   . Cancer Father 57       prostate  . Heart disease Maternal Grandfather   . Stroke Paternal Grandfather        multiple mini strokes  . Tuberculosis Paternal Grandfather   . Osteoporosis Paternal Grandmother   . Colon cancer Neg Hx     ROS:  Pertinent items are noted in HPI.  Otherwise, a comprehensive ROS was negative.  Exam:   BP 116/70   Pulse 68   Resp 16   Ht 5' 3.5" (1.613 m)   Wt 133 lb (60.3 kg)   LMP 01/11/2016 (Approximate)   BMI 23.19 kg/m  Height: 5' 3.5" (161.3 cm) Ht Readings from Last 3 Encounters:  10/19/17 5' 3.5" (1.613 m)  10/07/16 5' 3.75" (1.619 m)  10/04/15 5' 3.75" (1.619 m)    General appearance: alert, cooperative and appears stated  age Head: Normocephalic, without obvious abnormality, atraumatic Neck: no adenopathy, supple, symmetrical, trachea midline and thyroid normal to inspection and palpation Lungs: clear to auscultation bilaterally Breasts: normal appearance, no masses or tenderness, No nipple retraction or dimpling, No nipple discharge or bleeding, No axillary or supraclavicular adenopathy Heart: regular rate and rhythm Abdomen: soft, non-tender; no masses,  no organomegaly Extremities: extremities normal, atraumatic, no cyanosis or edema, bunions bilateral Skin: Skin color, texture, turgor normal. No rashes or lesions Lymph nodes: Cervical, supraclavicular, and axillary nodes normal. No abnormal inguinal nodes palpated Neurologic: Grossly normal   Pelvic: External genitalia:  no lesions,  Normal female, no lesions              Urethra:  normal appearing urethra with no masses, tenderness or lesions              Bartholin's and Skene's: normal                 Vagina: normal appearing vagina with normal color and discharge, no lesions              Cervix: no cervical motion tenderness, no lesions and normal appearance              Pap taken: No. Bimanual Exam:  Uterus:  normal size, contour, position, consistency, mobility, non-tender and anteverted              Adnexa: normal adnexa and no mass, fullness, tenderness               Rectovaginal: Confirms               Anus:  normal sphincter tone, no lesions  Chaperone present: yes  A:  Well Woman with normal exam  Menopausal no HRT  Bunions bilateral on great toe with MD follow up  Intentional weight loss and diet change with good health results  History of HSV 1 declines Rx, has plenty on hand.  History of Leep with abnormal pap 1996  P:   Reviewed health and wellness pertinent to exam  Discussed importance of notifying if vaginal bleeding occurs.  Continue follow up with MD as indicated.  Congratulated on her good diet change and  Encouraged to  continue.  Offered Rx update declined.  Pap smear: no   counseled on breast self exam, mammography screening, feminine hygiene, menopause, adequate intake of calcium and vitamin D, diet and exercise  return annually or prn  An After Visit Summary was printed and given to the patient.

## 2017-10-19 NOTE — Patient Instructions (Signed)

## 2017-11-05 ENCOUNTER — Ambulatory Visit
Admission: RE | Admit: 2017-11-05 | Discharge: 2017-11-05 | Disposition: A | Discharge status: Home / Self Care | Payer: Commercial Managed Care - PPO | Source: Ambulatory Visit | Attending: Certified Nurse Midwife | Admitting: Certified Nurse Midwife

## 2017-11-05 DIAGNOSIS — Z1231 Encounter for screening mammogram for malignant neoplasm of breast: Secondary | ICD-10-CM

## 2017-11-09 ENCOUNTER — Other Ambulatory Visit: Payer: Self-pay | Admitting: Certified Nurse Midwife

## 2017-11-09 DIAGNOSIS — R928 Other abnormal and inconclusive findings on diagnostic imaging of breast: Secondary | ICD-10-CM

## 2017-11-10 ENCOUNTER — Ambulatory Visit
Admission: RE | Admit: 2017-11-10 | Discharge: 2017-11-10 | Disposition: A | Discharge status: Home / Self Care | Payer: PRIVATE HEALTH INSURANCE | Source: Ambulatory Visit | Attending: Certified Nurse Midwife | Admitting: Certified Nurse Midwife

## 2017-11-10 DIAGNOSIS — R928 Other abnormal and inconclusive findings on diagnostic imaging of breast: Secondary | ICD-10-CM

## 2018-03-28 DIAGNOSIS — H16223 Keratoconjunctivitis sicca, not specified as Sjogren's, bilateral: Secondary | ICD-10-CM | POA: Insufficient documentation

## 2018-03-28 DIAGNOSIS — H5213 Myopia, bilateral: Secondary | ICD-10-CM | POA: Insufficient documentation

## 2018-03-28 DIAGNOSIS — H524 Presbyopia: Secondary | ICD-10-CM

## 2018-03-28 HISTORY — DX: Myopia, bilateral: H52.13

## 2018-03-28 HISTORY — DX: Keratoconjunctivitis sicca, not specified as Sjogren's, bilateral: H16.223

## 2018-07-19 ENCOUNTER — Telehealth: Payer: Self-pay | Admitting: Certified Nurse Midwife

## 2018-07-19 ENCOUNTER — Ambulatory Visit: Payer: PRIVATE HEALTH INSURANCE | Admitting: Certified Nurse Midwife

## 2018-07-19 ENCOUNTER — Encounter: Payer: Self-pay | Admitting: Certified Nurse Midwife

## 2018-07-19 VITALS — BP 112/60 | HR 88 | Temp 98.8°F | Resp 16 | Ht 63.5 in | Wt 136.0 lb

## 2018-07-19 DIAGNOSIS — N390 Urinary tract infection, site not specified: Secondary | ICD-10-CM

## 2018-07-19 DIAGNOSIS — N898 Other specified noninflammatory disorders of vagina: Secondary | ICD-10-CM | POA: Diagnosis not present

## 2018-07-19 DIAGNOSIS — R319 Hematuria, unspecified: Secondary | ICD-10-CM

## 2018-07-19 LAB — POCT URINALYSIS DIPSTICK
Bilirubin, UA: NEGATIVE
Glucose, UA: NEGATIVE
KETONES UA: NEGATIVE
Nitrite, UA: NEGATIVE
PROTEIN UA: POSITIVE — AB
UROBILINOGEN UA: 0.2 U/dL
pH, UA: 8 (ref 5.0–8.0)

## 2018-07-19 MED ORDER — NITROFURANTOIN MONOHYD MACRO 100 MG PO CAPS: 100.0000 mg | ORAL_CAPSULE | Freq: Two times a day (BID) | ORAL | 0 refills | Status: DC

## 2018-07-19 NOTE — Telephone Encounter (Signed)
Patient is seeing blood in urine and is having lower back pain.

## 2018-07-19 NOTE — Telephone Encounter (Signed)
Patient reports intercourse x last 7 days.  Felt bladder fullness this morning and saw blood in urine. Also feels vulvar itching and irritation.  No fevers. Symptoms started today.  Office visit with Melvia Heaps CNM  scheduled for today at 1545.  Encounter to [employer] CNM.  Encounter closed.

## 2018-07-19 NOTE — Patient Instructions (Signed)
Urinary Tract Infection, Adult A urinary tract infection (UTI) is an infection of any part of the urinary tract. The urinary tract includes:  The kidneys.  The ureters.  The bladder.  The urethra. These organs make, store, and get rid of pee (urine) in the body. What are the causes? This is caused by germs (bacteria) in your genital area. These germs grow and cause swelling (inflammation) of your urinary tract. What increases the risk? You are more likely to develop this condition if:  You have a small, thin tube (catheter) to drain pee.  You cannot control when you pee or poop (incontinence).  You are female, and: ? You use these methods to prevent pregnancy: ? A medicine that kills sperm (spermicide). ? A device that blocks sperm (diaphragm). ? You have low levels of a female hormone (estrogen). ? You are pregnant.  You have genes that add to your risk.  You are sexually active.  You take antibiotic medicines.  You have trouble peeing because of: ? A prostate that is bigger than normal, if you are female. ? A blockage in the part of your body that drains pee from the bladder (urethra). ? A kidney stone. ? A nerve condition that affects your bladder (neurogenic bladder). ? Not getting enough to drink. ? Not peeing often enough.  You have other conditions, such as: ? Diabetes. ? A weak disease-fighting system (immune system). ? Sickle cell disease. ? Gout. ? Injury of the spine. What are the signs or symptoms? Symptoms of this condition include:  Needing to pee right away (urgently).  Peeing often.  Peeing small amounts often.  Pain or burning when peeing.  Blood in the pee.  Pee that smells bad or not like normal.  Trouble peeing.  Pee that is cloudy.  Fluid coming from the vagina, if you are female.  Pain in the belly or lower back. Other symptoms include:  Throwing up (vomiting).  No urge to eat.  Feeling mixed up (confused).  Being tired  and grouchy (irritable).  A fever.  Watery poop (diarrhea). How is this treated? This condition may be treated with:  Antibiotic medicine.  Other medicines.  Drinking enough water. Follow these instructions at home:  Medicines  Take over-the-counter and prescription medicines only as told by your doctor.  If you were prescribed an antibiotic medicine, take it as told by your doctor. Do not stop taking it even if you start to feel better. General instructions  Make sure you: ? Pee until your bladder is empty. ? Do not hold pee for a long time. ? Empty your bladder after sex. ? Wipe from front to back after pooping if you are a female. Use each tissue one time when you wipe.  Drink enough fluid to keep your pee pale yellow.  Keep all follow-up visits as told by your doctor. This is important. Contact a doctor if:  You do not get better after 1-2 days.  Your symptoms go away and then come back. Get help right away if:  You have very bad back pain.  You have very bad pain in your lower belly.  You have a fever.  You are sick to your stomach (nauseous).  You are throwing up. Summary  A urinary tract infection (UTI) is an infection of any part of the urinary tract.  This condition is caused by germs in your genital area.  There are many risk factors for a UTI. These include having a small, thin   tube to drain pee and not being able to control when you pee or poop.  Treatment includes antibiotic medicines for germs.  Drink enough fluid to keep your pee pale yellow. This information is not intended to replace advice given to you by your health care provider. Make sure you discuss any questions you have with your health care provider. Document Released: 12/16/2007 Document Revised: 01/06/2018 Document Reviewed: 01/06/2018 Elsevier Interactive Patient Education  2019 Elsevier Inc.  

## 2018-07-19 NOTE — Progress Notes (Signed)
58 y.o. Married Caucasian female G1P0010 here with complaint of vaginal symptoms of itching, burning in vaginal area. Has had sexual activity for the past 7 days and feels this has contributed to vaginal symptoms.  Onset of symptoms today. Denies new personal products. Some vaginal dryness, using coconut oil for dryness. No STD concerns. Urinary symptoms frequency, urgency, blood in urine, burning and pain with urination, lower back pain, bloating, chills, and itchy around rectum . Contraception is postmenopausal  Review of Systems  Constitutional: Positive for chills.  HENT: Negative.   Eyes: Negative.   Respiratory: Negative.   Cardiovascular: Negative.   Gastrointestinal:       Bloating   Genitourinary: Positive for frequency, hematuria and urgency.       Pain and burning with urination  Musculoskeletal:       Muscle weakness  Skin: Positive for itching.  Neurological: Negative.   Endo/Heme/Allergies: Negative.   Psychiatric/Behavioral: Negative.     O:Healthy female WDWN Affect: normal, orientation x 3  Exam: Skin: warm and dry CVAT: bilateral negative Abdomen: soft, with positive Suprapubic tenderness Inguinal Lymph nodes: no enlargement or tenderness Pelvic exam: External genital: normal female Bladder, urethral meatus, urethra tender Vagina: scant normal appearing discharge noted., Affirm taken Cervix: normal, non tender, no CMT Uterus: normal, non tender Adnexa:normal, non tender, no masses or fullness noted Rectal Exam: no hemorrhoid palpated, normal appearance   A:Normal pelvic exam Acute UTI post coital suspected Menopausal with vaginal dryness, R/O vaginal infection  P:Discussed findings of normal pelvic exam and UTI and etiology of post coital suspected with history. Warning signs of UTI given and need to advise if occurs. Make sure to increase water intake and can add unsweetened cranberry juice also. Discussed vaginal dryness can increase risk of vaginal  infection and UTI. Continue vaginal moisture with intercourse, but would avoid sexual activity until UTI resolved. Consider post coital prevention once urine culture and micro in. Questions addressed. Lab: Urine micro and culture Lab: Affirm will treat if indicated. Rx Macrobid 100 mg bid x 7 see order  Rv prn

## 2018-07-20 ENCOUNTER — Telehealth: Payer: Self-pay

## 2018-07-20 LAB — VAGINITIS/VAGINOSIS, DNA PROBE
CANDIDA SPECIES: NEGATIVE
Gardnerella vaginalis: NEGATIVE
TRICHOMONAS VAG: NEGATIVE

## 2018-07-20 LAB — URINALYSIS, MICROSCOPIC ONLY: Casts: NONE SEEN /lpf

## 2018-07-20 NOTE — Telephone Encounter (Signed)
-----   Message from Regina Eck, CNM sent at 07/20/2018 10:49 AM EST ----- Notify patient her urine was positive for WBC and RBC's as we suspected. Not a large amount of bacteria, which is good. Continue medication as directed. Culture pending Patient status

## 2018-07-20 NOTE — Telephone Encounter (Signed)
Patient notified of results. See lab 

## 2018-07-20 NOTE — Telephone Encounter (Signed)
lmtcb

## 2018-07-22 LAB — URINE CULTURE

## 2018-07-28 ENCOUNTER — Telehealth: Payer: Self-pay | Admitting: Certified Nurse Midwife

## 2018-07-28 MED ORDER — NITROFURANTOIN MONOHYD MACRO 100 MG PO CAPS: ORAL_CAPSULE | ORAL | 0 refills | Status: DC

## 2018-07-28 NOTE — Telephone Encounter (Signed)
Patient calling to confirm that lab results have been faxed for Natalie [name] to review.

## 2018-07-28 NOTE — Telephone Encounter (Signed)
Lab results to Melvia Heaps CNM to review.  + Nitrites, Small leukocytes, Urine WBC 24.   Orders received from [place].   Macrobid 100 mg po BID x 3 days (to complete 10 days of treatment) and then 1 tab po prn UTI prophylaxis and BID x 5 days if symptoms develop.   Patient agreeable.   Will discuss further with Melvia Heaps CNM at annual exam.   Urine culture pending.  Encounter closed.

## 2018-07-28 NOTE — Telephone Encounter (Signed)
Patient completed Macrobid on Monday night.  Had intercourse Monday night, Had intercourse Tuesday night and last night woke up at 0330 with dysuria, cloudy urine and pain/pressure with voiding.  No blood in urine. No fevers. No flank pain.   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  Asking for Orders to have UA/Micro and culture so she can do a clean catch at work if possible.   Fax orders to [phone].

## 2018-07-28 NOTE — Telephone Encounter (Signed)
Patient finished medication for UTI on 07/25/18. States as of this morning, infection is back. Patient aware she may need to be seen again.

## 2018-07-28 NOTE — Telephone Encounter (Signed)
Message left to return call to [place] at [phone].   Ecoli UTI on culture 07/19/2018, treated and sensitvie to [place]. Started on Macrobid 07/19/2018 for 7 days.

## 2018-08-02 ENCOUNTER — Telehealth: Payer: Self-pay | Admitting: Certified Nurse Midwife

## 2018-08-02 ENCOUNTER — Telehealth: Payer: Self-pay

## 2018-08-02 MED ORDER — CIPROFLOXACIN HCL 500 MG PO TABS: ORAL_TABLET | ORAL | 0 refills | Status: DC

## 2018-08-02 NOTE — Telephone Encounter (Signed)
Message left to return call to [place] at [phone].      Need to know if any allergies and if can tolerate Cipro or Bactrim DS.

## 2018-08-02 NOTE — Telephone Encounter (Signed)
Paper results to Natalie [name] CNM to review and advise for new order.

## 2018-08-02 NOTE — Telephone Encounter (Signed)
Spoke with patient.  States she is feeling improved, but not 100%.   Has taken Cipro before without issue.   Please advise for Cipro order. Thank you.

## 2018-08-02 NOTE — Telephone Encounter (Signed)
Order placed, patient given instructions and side effect information about Cipro. Call back with any questions.  Encounter closed.

## 2018-08-02 NOTE — Telephone Encounter (Signed)
Care Everywhere Result Report Urine CultureResulted: 07/31/2018 10:03 AM [facility] Component Name Value Ref Range  Urine culture ID >100,000 CFU/ML Proteus mirabilis (A)   Specimen Collected on  Urine - Clean Catch 07/28/2018 9:57 AM   Organism Antibiotic Method Susceptibility  Proteus mirabilis Organism ID MICRO MIC SUSCEPTIBILITY    Amoxicillin + K Clavulanate MICRO MIC SUSCEPTIBILITY <=4/2: Susceptible   Ampicillin MICRO MIC SUSCEPTIBILITY <=8: Susceptible   Ampicillin/Sulbactam MICRO MIC SUSCEPTIBILITY <=1/0.5: Susceptible   Aztreonam MICRO MIC SUSCEPTIBILITY <=4: Susceptible   Cefazolin MICRO MIC SUSCEPTIBILITY <=2: Susceptible   Cefotaxime MICRO MIC SUSCEPTIBILITY <=2: Susceptible   Ceftriaxone MICRO MIC SUSCEPTIBILITY <=1: Susceptible   Cefuroxime MICRO MIC SUSCEPTIBILITY <=4: Susceptible   Ciprofloxacin MICRO MIC SUSCEPTIBILITY <=1: Susceptible   Ertapenem MICRO MIC SUSCEPTIBILITY <=0.5: Susceptible   Gentamicin MICRO MIC SUSCEPTIBILITY <=1: Susceptible   Meropenem MICRO MIC SUSCEPTIBILITY <=1: Susceptible   Nitrofurantoin MICRO MIC SUSCEPTIBILITY >64: Resistant   Piperacillin/Tazobactam MICRO MIC SUSCEPTIBILITY <=4: Susceptible   Tetracycline MICRO MIC SUSCEPTIBILITY >8: Resistant   Trimethoprim/Sulfamethoxazole MICRO MIC SUSCEPTIBILITY <=2/38: Susceptible  Comment:  1) Results of ampicillin testing can be used to predict results for amoxicillin  2) In the treatment of uncomplicated UTIs, cefazolin susceptibility predicts susceptibility to the oral cephalosporins cephalexin, cefuroxime, cefpodoxime, and cefdinir. Cephalexin is cost-effective, but QID dosing (normal renal function) is less convenient than the other oral cephalosporins, which are dosed BID. Isolates resistant to cefazolin but susceptible to ceftriaxone may be susceptible to cefpodoxime.

## 2018-08-02 NOTE — Telephone Encounter (Signed)
Opened in error

## 2018-08-02 NOTE — Telephone Encounter (Signed)
Cipro 500 mg bid x 3 day

## 2018-08-02 NOTE — Telephone Encounter (Signed)
Patient calling for urine results.

## 2018-11-10 ENCOUNTER — Ambulatory Visit: Payer: PRIVATE HEALTH INSURANCE | Admitting: Certified Nurse Midwife

## 2018-12-09 ENCOUNTER — Other Ambulatory Visit: Payer: Self-pay | Admitting: Certified Nurse Midwife

## 2018-12-09 DIAGNOSIS — Z1231 Encounter for screening mammogram for malignant neoplasm of breast: Secondary | ICD-10-CM

## 2019-01-03 ENCOUNTER — Ambulatory Visit: Payer: PRIVATE HEALTH INSURANCE | Admitting: Certified Nurse Midwife

## 2019-01-03 ENCOUNTER — Other Ambulatory Visit (HOSPITAL_COMMUNITY)
Admission: RE | Admit: 2019-01-03 | Discharge: 2019-01-03 | Disposition: A | Discharge status: Home / Self Care | Payer: PRIVATE HEALTH INSURANCE | Source: Ambulatory Visit | Attending: Certified Nurse Midwife | Admitting: Certified Nurse Midwife

## 2019-01-03 ENCOUNTER — Encounter: Payer: Self-pay | Admitting: Certified Nurse Midwife

## 2019-01-03 ENCOUNTER — Other Ambulatory Visit: Payer: Self-pay

## 2019-01-03 VITALS — BP 104/68 | HR 68 | Resp 16 | Ht 63.25 in | Wt 132.0 lb

## 2019-01-03 DIAGNOSIS — Z01419 Encounter for gynecological examination (general) (routine) without abnormal findings: Secondary | ICD-10-CM | POA: Diagnosis not present

## 2019-01-03 DIAGNOSIS — E559 Vitamin D deficiency, unspecified: Secondary | ICD-10-CM

## 2019-01-03 DIAGNOSIS — B009 Herpesviral infection, unspecified: Secondary | ICD-10-CM

## 2019-01-03 DIAGNOSIS — Z124 Encounter for screening for malignant neoplasm of cervix: Secondary | ICD-10-CM

## 2019-01-03 DIAGNOSIS — Z0000 Encounter for general adult medical examination without abnormal findings: Secondary | ICD-10-CM | POA: Diagnosis not present

## 2019-01-03 MED ORDER — VALACYCLOVIR HCL 1 G PO TABS: 1000.0000 mg | ORAL_TABLET | Freq: Two times a day (BID) | ORAL | 1 refills | Status: DC

## 2019-01-03 NOTE — Progress Notes (Signed)
58 y.o. G20P0010 Married  Caucasian Fe here for annual exam. Post menopausal no vaginal bleeding. Aware of height loss and has been working on stretching for bone health. Now on pre and probiotic and having normal stools again. Feels like a new person. No HSV 1 outbreaks in the past year. Has Valtrex.   Patient's last menstrual period was 01/11/2016 (approximate).          Sexually active: Yes.    The current method of family planning is vasectomy.    Exercising: No.  exercise Smoker:  no  Review of Systems  Constitutional: Negative.   HENT: Negative.   Eyes: Negative.   Respiratory: Negative.   Cardiovascular: Negative.   Gastrointestinal: Negative.   Genitourinary: Negative.   Musculoskeletal: Negative.   Skin: Negative.   Neurological: Negative.   Endo/Heme/Allergies: Negative.   Psychiatric/Behavioral: Negative.     Health Maintenance: Pap:  10-04-15 neg HPV HR neg History of Abnormal Pap: yes MMG:  11-10-17 bilateral & rt breast category c density birads 2:neg, scheduled for next month Self Breast exams: yes Colonoscopy:  2014 f/u 19yrs BMD:  none TDaP:  2012 Shingles: no Pneumonia: no Hep C and HIV: hep c neg 2014 Labs: if needed   reports that she quit smoking about 9 years ago. Her smoking use included cigarettes. She has never used smokeless tobacco. She reports current alcohol use of about 7.0 - 8.0 standard drinks of alcohol per week. She reports that she does not use drugs.  Past Medical History:  Diagnosis Date  . Abnormal Pap smear of cervix 1996  . Abnormal uterine bleeding   . Allergy    seasonal  . Anxiety    past  . Bunion   . Bursitis of hip 1/11   left  . Constipation 12/24/2010  . Fracture of arm 2004  . Hip pain, left 12/24/2010  . HPV test positive   . HSV-1 (herpes simplex virus 1) infection   . Lipoma   . Lipoma of arm 02/14/2013  . Mononucleosis   . Preventative health care 12/24/2010  . Recurrent oral ulcers 07/22/2011  . Vitamin D deficiency  2-12    Past Surgical History:  Procedure Laterality Date  . APPENDECTOMY  58 yrs old  . CERVICAL BIOPSY  W/ LOOP ELECTRODE EXCISION  1996   LGSIL  . INTRAUTERINE DEVICE INSERTION  09/24/1997   Paraguard    Current Outpatient Medications  Medication Sig Dispense Refill  . Alum & Mag Hydroxide-Simeth (MAGIC MOUTHWASH) SOLN Take 10 mLs by mouth 4 (four) times daily as needed for mouth pain. 15 mL 0  . aspirin-acetaminophen-caffeine (EXCEDRIN MIGRAINE) 854-627-03 MG per tablet Take by mouth every 6 (six) hours as needed for headache.    Marland Kitchen CALCIUM PO Take by mouth daily.    . cetirizine (ZYRTEC) 10 MG tablet Take 10 mg by mouth daily.    . ciprofloxacin (CIPRO) 500 MG tablet 1 tab po bid for three days. 6 tablet 0  . Glucosamine HCl (GLUCOSAMINE PO) Take by mouth.    . Multiple Vitamins-Minerals (MULTIVITAMIN PO) Take by mouth daily.    . nitrofurantoin, macrocrystal-monohydrate, (MACROBID) 100 MG capsule 1 capsule BID x 3 days. Then, 1 cap prn for UTI prophylaxis. If symptoms persist increase to two capsules BID x 5 days. 30 capsule 0  . valACYclovir (VALTREX) 1000 MG tablet Take 1 tablet (1,000 mg total) by mouth 2 (two) times daily. X 2 days (Patient taking differently: Take 1,000 mg by mouth as  needed. X 2 days) 30 tablet 1   No current facility-administered medications for this visit.     Family History  Problem Relation Age of Onset  . Cancer Mother 75       breast- now bones and brain  . Hypertension Mother   . Heart disease Mother   . Breast cancer Mother   . Cancer Father 22       prostate  . Heart disease Maternal Grandfather   . Stroke Paternal Grandfather        multiple mini strokes  . Tuberculosis Paternal Grandfather   . Osteoporosis Paternal Grandmother   . Colon cancer Neg Hx     ROS:  Pertinent items are noted in HPI.  Otherwise, a comprehensive ROS was negative.  Exam:   LMP 01/11/2016 (Approximate)    Ht Readings from Last 3 Encounters:  07/19/18 5'  3.5" (1.613 m)  10/19/17 5' 3.5" (1.613 m)  10/07/16 5' 3.75" (1.619 m)    General appearance: alert, cooperative and appears stated age Head: Normocephalic, without obvious abnormality, atraumatic Neck: no adenopathy, supple, symmetrical, trachea midline and thyroid normal to inspection and palpation Lungs: clear to auscultation bilaterally Breasts: normal appearance, no masses or tenderness, No nipple retraction or dimpling, No nipple discharge or bleeding, No axillary or supraclavicular adenopathy Heart: regular rate and rhythm Abdomen: soft, non-tender; no masses,  no organomegaly Extremities: extremities normal, atraumatic, no cyanosis or edema Skin: Skin color, texture, turgor normal. No rashes or lesions Lymph nodes: Cervical, supraclavicular, and axillary nodes normal. No abnormal inguinal nodes palpated Neurologic: Grossly normal   Pelvic: External genitalia:  no lesions, normal female              Urethra:  normal appearing urethra with no masses, tenderness or lesions              Bartholin's and Skene's: normal                 Vagina: normal appearing vagina with normal color and discharge, no lesions              Cervix: no cervical motion tenderness, no lesions and normal appearance              Pap taken: No. Bimanual Exam:  Uterus:  normal size, contour, position, consistency, mobility, non-tender and anteverted              Adnexa: normal adnexa and no mass, fullness, tenderness               Rectovaginal: Confirms               Anus:  normal sphincter tone, no lesions  Chaperone present: yes  A:  Well Woman with normal exam  Menopausal no HRT  Vaginal dryness coconut oil working well  Chronic constipation resolved with probiotic use  History of abnormal pap smear  Screening labs  P:   Reviewed health and wellness pertinent to exam  Aware of need to advise if vaginal bleeding.  Continue prn use.  Stressed importance of continuing use for best success with  and plan colonoscopy now.  Lab: CMP, Lipid panel, TSH, CBC,Vitamin D Hgb A1-C  Pap smear: yes   counseled on breast self exam, mammography screening, feminine hygiene, adequate intake of calcium and vitamin D, diet and exercise, Kegel's exercises  return annually or prn  An After Visit Summary was printed and given to the patient.

## 2019-01-04 ENCOUNTER — Telehealth: Payer: Self-pay | Admitting: Certified Nurse Midwife

## 2019-01-04 LAB — TSH: TSH: 3.1 u[IU]/mL (ref 0.450–4.500)

## 2019-01-04 LAB — CBC
Hematocrit: 41.4 % (ref 34.0–46.6)
Hemoglobin: 13.7 g/dL (ref 11.1–15.9)
MCH: 31 pg (ref 26.6–33.0)
MCHC: 33.1 g/dL (ref 31.5–35.7)
MCV: 94 fL (ref 79–97)
Platelets: 311 10*3/uL (ref 150–450)
RBC: 4.42 x10E6/uL (ref 3.77–5.28)
RDW: 13.5 % (ref 11.7–15.4)
WBC: 3.8 10*3/uL (ref 3.4–10.8)

## 2019-01-04 LAB — COMPREHENSIVE METABOLIC PANEL
ALT: 17 IU/L (ref 0–32)
AST: 21 IU/L (ref 0–40)
Albumin/Globulin Ratio: 2.3 — ABNORMAL HIGH (ref 1.2–2.2)
Albumin: 4.4 g/dL (ref 3.8–4.9)
Alkaline Phosphatase: 89 IU/L (ref 39–117)
BUN/Creatinine Ratio: 26 — ABNORMAL HIGH (ref 9–23)
BUN: 15 mg/dL (ref 6–24)
Bilirubin Total: 0.6 mg/dL (ref 0.0–1.2)
CO2: 26 mmol/L (ref 20–29)
Calcium: 9.2 mg/dL (ref 8.7–10.2)
Chloride: 102 mmol/L (ref 96–106)
Creatinine, Ser: 0.58 mg/dL (ref 0.57–1.00)
GFR calc Af Amer: 118 mL/min/{1.73_m2} (ref >59)
GFR calc non Af Amer: 103 mL/min/{1.73_m2} (ref >59)
Globulin, Total: 1.9 g/dL (ref 1.5–4.5)
Glucose: 84 mg/dL (ref 65–99)
Potassium: 4.6 mmol/L (ref 3.5–5.2)
Sodium: 141 mmol/L (ref 134–144)
Total Protein: 6.3 g/dL (ref 6.0–8.5)

## 2019-01-04 LAB — CYTOLOGY - PAP
Diagnosis: NEGATIVE
HPV: NOT DETECTED

## 2019-01-04 LAB — LIPID PANEL
Chol/HDL Ratio: 1.9 ratio (ref 0.0–4.4)
Cholesterol, Total: 201 mg/dL — ABNORMAL HIGH (ref 100–199)
HDL: 104 mg/dL (ref >39)
LDL Calculated: 86 mg/dL (ref 0–99)
Triglycerides: 53 mg/dL (ref 0–149)
VLDL Cholesterol Cal: 11 mg/dL (ref 5–40)

## 2019-01-04 LAB — HEMOGLOBIN A1C
Est. average glucose Bld gHb Est-mCnc: 108 mg/dL
Hgb A1c MFr Bld: 5.4 % (ref 4.8–5.6)

## 2019-01-04 LAB — VITAMIN D 25 HYDROXY (VIT D DEFICIENCY, FRACTURES): Vit D, 25-Hydroxy: 35 ng/mL (ref 30.0–100.0)

## 2019-01-04 NOTE — Telephone Encounter (Signed)
Natalie [name], CNM -please advise. Per review of AEX patient has never had a BMD.

## 2019-01-04 NOTE — Telephone Encounter (Signed)
Spoke with patient. Advised as seen below per Melvia Heaps, CNM. Patient verbalizes understanding and is agreeable.   Encounter closed.

## 2019-01-04 NOTE — Telephone Encounter (Signed)
I think she wait and speak with surgeon BMD usually not recommended until 61

## 2019-01-04 NOTE — Telephone Encounter (Signed)
Patient sent the following correspondence through [facility]. Routing to triage to assist patient with request.  I would like to have a bone density test in preparation for having foot surgery. Would you be able to justify placing this order or should I wait until I speak with the surgeon in July.

## 2019-01-25 ENCOUNTER — Other Ambulatory Visit: Payer: Self-pay

## 2019-01-25 ENCOUNTER — Ambulatory Visit
Admission: RE | Admit: 2019-01-25 | Discharge: 2019-01-25 | Disposition: A | Discharge status: Home / Self Care | Payer: PRIVATE HEALTH INSURANCE | Source: Ambulatory Visit | Attending: Certified Nurse Midwife | Admitting: Certified Nurse Midwife

## 2019-01-25 DIAGNOSIS — Z1231 Encounter for screening mammogram for malignant neoplasm of breast: Secondary | ICD-10-CM

## 2019-07-11 ENCOUNTER — Telehealth: Payer: Self-pay | Admitting: Certified Nurse Midwife

## 2019-07-11 DIAGNOSIS — B009 Herpesviral infection, unspecified: Secondary | ICD-10-CM

## 2019-07-11 NOTE — Telephone Encounter (Signed)
Patient is calling to request a refill on Valacyclovir 1000MG  tablet. Patient stated that she has 2 pills left and is calling for a refill. Patient confirmed pharmacy as [facility] in [place].

## 2019-07-11 NOTE — Telephone Encounter (Signed)
Med refill request: valtrex Last AEX: 01/03/2019 Next AEX: 01/09/2020 Last MMG (if hormonal med) n/a Refill authorized: #30 with 1 RF  Orders pended if approved.   Will route to Dr Talbert Nan for Debbi, CNM absence in office.  FYI D. Hollice Espy, CNM

## 2019-07-12 MED ORDER — VALACYCLOVIR HCL 1 G PO TABS: 1000.0000 mg | ORAL_TABLET | Freq: Two times a day (BID) | ORAL | 1 refills | Status: DC

## 2019-07-12 NOTE — Telephone Encounter (Signed)
Spoke with patient, notified of refill. Patient verbalizes understanding.   Encounter closed.

## 2019-10-02 ENCOUNTER — Encounter: Payer: Self-pay | Admitting: Certified Nurse Midwife

## 2019-12-26 ENCOUNTER — Other Ambulatory Visit: Payer: Self-pay | Admitting: Obstetrics and Gynecology

## 2019-12-26 DIAGNOSIS — Z1231 Encounter for screening mammogram for malignant neoplasm of breast: Secondary | ICD-10-CM

## 2020-01-09 ENCOUNTER — Encounter: Payer: Self-pay | Admitting: Obstetrics and Gynecology

## 2020-01-09 ENCOUNTER — Other Ambulatory Visit: Payer: Self-pay

## 2020-01-09 ENCOUNTER — Ambulatory Visit: Payer: PRIVATE HEALTH INSURANCE | Admitting: Certified Nurse Midwife

## 2020-01-09 ENCOUNTER — Ambulatory Visit: Payer: PRIVATE HEALTH INSURANCE | Admitting: Obstetrics and Gynecology

## 2020-01-09 VITALS — BP 108/62 | HR 75 | Temp 98.4°F | Ht 63.0 in | Wt 137.0 lb

## 2020-01-09 DIAGNOSIS — Z803 Family history of malignant neoplasm of breast: Secondary | ICD-10-CM | POA: Diagnosis not present

## 2020-01-09 DIAGNOSIS — E559 Vitamin D deficiency, unspecified: Secondary | ICD-10-CM | POA: Diagnosis not present

## 2020-01-09 DIAGNOSIS — Z23 Encounter for immunization: Secondary | ICD-10-CM | POA: Diagnosis not present

## 2020-01-09 DIAGNOSIS — K602 Anal fissure, unspecified: Secondary | ICD-10-CM

## 2020-01-09 DIAGNOSIS — Z01419 Encounter for gynecological examination (general) (routine) without abnormal findings: Secondary | ICD-10-CM

## 2020-01-09 DIAGNOSIS — Z0000 Encounter for general adult medical examination without abnormal findings: Secondary | ICD-10-CM

## 2020-01-09 DIAGNOSIS — M2550 Pain in unspecified joint: Secondary | ICD-10-CM

## 2020-01-09 NOTE — Patient Instructions (Addendum)
Try magnesium 500 mg a day for constipation  EXERCISE AND DIET:  We recommended that you start or continue a regular exercise program for good health. Regular exercise means any activity that makes your heart beat faster and makes you sweat.  We recommend exercising at least 30 minutes per day at least 3 days a week, preferably 4 or 5.  We also recommend a diet low in fat and sugar.  Inactivity, poor dietary choices and obesity can cause diabetes, heart attack, stroke, and kidney damage, among others.    ALCOHOL AND SMOKING:  Women should limit their alcohol intake to no more than 7 drinks/beers/glasses of wine (combined, not each!) per week. Moderation of alcohol intake to this level decreases your risk of breast cancer and liver damage. And of course, no recreational drugs are part of a healthy lifestyle.  And absolutely no smoking or even second hand smoke. Most people know smoking can cause heart and lung diseases, but did you know it also contributes to weakening of your bones? Aging of your skin?  Yellowing of your teeth and nails?  CALCIUM AND VITAMIN D:  Adequate intake of calcium and Vitamin D are recommended.  The recommendations for exact amounts of these supplements seem to change often, but generally speaking 1,200 mg of calcium (between diet and supplement) and 800 units of Vitamin D per day seems prudent. Certain women may benefit from higher intake of Vitamin D.  If you are among these women, your doctor will have told you during your visit.    PAP SMEARS:  Pap smears, to check for cervical cancer or precancers,  have traditionally been done yearly, although recent scientific advances have shown that most women can have pap smears less often.  However, every woman still should have a physical exam from her gynecologist every year. It will include a breast check, inspection of the vulva and vagina to check for abnormal growths or skin changes, a visual exam of the cervix, and then an exam to  evaluate the size and shape of the uterus and ovaries.  And after 58 years of age, a rectal exam is indicated to check for rectal cancers. We will also provide age appropriate advice regarding health maintenance, like when you should have certain vaccines, screening for sexually transmitted diseases, bone density testing, colonoscopy, mammograms, etc.   MAMMOGRAMS:  All women over 58 years old should have a yearly mammogram. Many facilities now offer a "3D" mammogram, which may cost around $50 extra out of pocket. If possible,  we recommend you accept the option to have the 3D mammogram performed.  It both reduces the number of women who will be called back for extra views which then turn out to be normal, and it is better than the routine mammogram at detecting truly abnormal areas.    COLON CANCER SCREENING: Now recommend starting at age 58. At this time colonoscopy is not covered for routine screening until 50. There are take home tests that can be done between 45-49.   COLONOSCOPY:  Colonoscopy to screen for colon cancer is recommended for all women at age 56.  We know, you hate the idea of the prep.  We agree, BUT, having colon cancer and not knowing it is worse!!  Colon cancer so often starts as a polyp that can be seen and removed at colonscopy, which can quite literally save your life!  And if your first colonoscopy is normal and you have no family history of colon cancer,  most women don't have to have it again for 10 years.  Once every ten years, you can do something that may end up saving your life, right?  We will be happy to help you get it scheduled when you are ready.  Be sure to check your insurance coverage so you understand how much it will cost.  It may be covered as a preventative service at no cost, but you should check your particular policy.      Breast Self-Awareness Breast self-awareness means being familiar with how your breasts look and feel. It involves checking your breasts  regularly and reporting any changes to your health care provider. Practicing breast self-awareness is important. A change in your breasts can be a sign of a serious medical problem. Being familiar with how your breasts look and feel allows you to find any problems early, when treatment is more likely to be successful. All women should practice breast self-awareness, including women who have had breast implants. How to do a breast self-exam One way to learn what is normal for your breasts and whether your breasts are changing is to do a breast self-exam. To do a breast self-exam: Look for Changes  1. Remove all the clothing above your waist. 2. Stand in front of a mirror in a room with good lighting. 3. Put your hands on your hips. 4. Push your hands firmly downward. 5. Compare your breasts in the mirror. Look for differences between them (asymmetry), such as: ? Differences in shape. ? Differences in size. ? Puckers, dips, and bumps in one breast and not the other. 6. Look at each breast for changes in your skin, such as: ? Redness. ? Scaly areas. 7. Look for changes in your nipples, such as: ? Discharge. ? Bleeding. ? Dimpling. ? Redness. ? A change in position. Feel for Changes Carefully feel your breasts for lumps and changes. It is best to do this while lying on your back on the floor and again while sitting or standing in the shower or tub with soapy water on your skin. Feel each breast in the following way:  Place the arm on the side of the breast you are examining above your head.  Feel your breast with the other hand.  Start in the nipple area and make  inch (2 cm) overlapping circles to feel your breast. Use the pads of your three middle fingers to do this. Apply light pressure, then medium pressure, then firm pressure. The light pressure will allow you to feel the tissue closest to the skin. The medium pressure will allow you to feel the tissue that is a little deeper. The  firm pressure will allow you to feel the tissue close to the ribs.  Continue the overlapping circles, moving downward over the breast until you feel your ribs below your breast.  Move one finger-width toward the center of the body. Continue to use the  inch (2 cm) overlapping circles to feel your breast as you move slowly up toward your collarbone.  Continue the up and down exam using all three pressures until you reach your armpit.  Write Down What You Find  Write down what is normal for each breast and any changes that you find. Keep a written record with breast changes or normal findings for each breast. By writing this information down, you do not need to depend only on memory for size, tenderness, or location. Write down where you are in your menstrual cycle, if you are  still menstruating. If you are having trouble noticing differences in your breasts, do not get discouraged. With time you will become more familiar with the variations in your breasts and more comfortable with the exam. How often should I examine my breasts? Examine your breasts every month. If you are breastfeeding, the best time to examine your breasts is after a feeding or after using a breast pump. If you menstruate, the best time to examine your breasts is 5-7 days after your period is over. During your period, your breasts are lumpier, and it may be more difficult to notice changes. When should I see my health care provider? See your health care provider if you notice:  A change in shape or size of your breasts or nipples.  A change in the skin of your breast or nipples, such as a reddened or scaly area.  Unusual discharge from your nipples.  A lump or thick area that was not there before.  Pain in your breasts.  Anything that concerns you.  Anal Fissure, Adult  An anal fissure is a small tear or crack in the tissue of the anus. Bleeding from a fissure usually stops on its own within a few minutes. However,  bleeding will often occur again with each bowel movement until the fissure heals. What are the causes? This condition is usually caused by passing a large or hard stool (feces). Other causes include:  Constipation.  Frequent diarrhea.  Inflammatory bowel disease (Crohn's disease or ulcerative colitis).  Childbirth.  Infections.  Anal sex. What are the signs or symptoms? Symptoms of this condition include:  Bleeding from the rectum.  Small amounts of blood seen on your stool, on the toilet paper, or in the toilet after a bowel movement. The blood coats the outside of the stool and is not mixed with the stool.  Painful bowel movements.  Itching or irritation around the anus. How is this diagnosed? A health care provider may diagnose this condition by closely examining the anal area. An anal fissure can usually be seen with careful inspection. In some cases, a rectal exam may be performed, or a short tube (anoscope) may be used to examine the anal canal. How is this treated? Initial treatment for this condition may include:  Taking steps to avoid constipation. This may include making changes to your diet, such as increasing your intake of fiber or fluid.  Taking fiber supplements. These supplements can soften your stool to help make bowel movements easier. Your health care provider may also prescribe a stool softener if your stool is hard.  Taking sitz baths. This may help to heal the tear.  Using medicated creams or ointments. These may be prescribed to lessen discomfort. Treatments that are sometimes used if initial treatments do not work well or if the condition is more severe may include:  Botulinum injection.  Surgery to repair the fissure. Follow these instructions at home: Eating and drinking   Avoid foods that may cause constipation, such as bananas, milk, and other dairy products.  Eat all fruits, except bananas.  Drink enough fluid to keep your urine pale  yellow.  Eat foods that are high in fiber, such as beans, whole grains, and fresh fruits and vegetables. General instructions   Take over-the-counter and prescription medicines only as told by your health care provider.  Use creams or ointments only as told by your health care provider.  Keep the anal area clean and dry.  Take sitz baths as told by  your health care provider. Do not use soap in the sitz baths.  Keep all follow-up visits as told by your health care provider. This is important. Contact a health care provider if you have:  More bleeding.  A fever.  Diarrhea that is mixed with blood.  Pain that continues.  Ongoing problems that are getting worse rather than better. Summary  An anal fissure is a small tear or crack in the tissue of the anus. This condition is usually caused by passing a large or hard stool (feces). Other causes include constipation and frequent diarrhea.  Initial treatment for this condition may include taking steps to avoid constipation, such as increasing your intake of fiber or fluid.  Follow instructions for care as told by your health care provider.  Contact your health care provider if you have more bleeding or your problem is getting worse rather than better.  Keep all follow-up visits as told by your health care provider. This is important. This information is not intended to replace advice given to you by your health care provider. Make sure you discuss any questions you have with your health care provider. Document Revised: 12/09/2017 Document Reviewed: 12/09/2017 Elsevier Patient Education  2020 [employer]. About Constipation  Constipation Overview Constipation is the most common gastrointestinal complaint -- about 4 million Americans experience constipation and make 2.5 million physician visits a year to get help for the problem.  Constipation can occur when the colon absorbs too much water, the colon's muscle contraction is slow  or sluggish, and/or there is delayed transit time through the colon.  The result is stool that is hard and dry.  Indicators of constipation include straining during bowel movements greater than 25% of the time, having fewer than three bowel movements per week, and/or the feeling of incomplete evacuation.  There are established guidelines ([place] II ) for defining constipation. A person needs to have two or more of the following symptoms for at least 12 weeks (not necessarily consecutive) in the preceding 12 months: . Straining in  greater than 25% of bowel movements . Lumpy or hard stools in greater than 25% of bowel movements . Sensation of incomplete emptying in greater than 25% of bowel movements . Sensation of anorectal obstruction/blockade in greater than 25% of bowel movements . Manual maneuvers to help empty greater than 25% of bowel movements (e.g., digital evacuation, support of the pelvic floor)  . Less than  3 bowel movements/week . Loose stools are not present, and criteria for irritable bowel syndrome are insufficient  Common Causes of Constipation . Lack of fiber in your diet . Lack of physical activity . Medications, including iron and calcium supplements  . Dairy intake . Dehydration . Abuse of laxatives  Travel  Irritable Bowel Syndrome  Pregnancy  Luteal phase of menstruation (after ovulation and before menses)  Colorectal problems  Intestinal Dysfunction  Treating Constipation  There are several ways of treating constipation, including changes to diet and exercise, use of laxatives, adjustments to the pelvic floor, and scheduled toileting.  These treatments include: . increasing fiber and fluids in the diet  . increasing physical activity . learning muscle coordination   learning proper toileting techniques and toileting modifications   designing and sticking  to a toileting schedule     2007, [employer] Doc.22

## 2020-01-09 NOTE — Progress Notes (Signed)
59 y.o. G1P0010 Married White or Caucasian Not Hispanic or Latino female here for annual exam. Patient states that she is struggling with painful bowel movents.   She has been having constipated for 3 years. Has a BM every 2-3 days. She has added more fiber to her diet which has helped, she has tried a probiotic which helped, then had to change to another one. In the past prunes have helped her. BM hurt when they come out, feels she is tearing. Has been feeling this way for ~1 year. When she has a bulk stool it doesn't hurt.  Frequently sexually active, no dyspareunia. Uses coconut oil.  She has a h/o frequent UTI's, related to prolonged intercourse. She has antibiotics to take (just uses it on occasion). She doesn't need a script currently.     H/O HSV 1, has valtrex for prn use, rare outbreaks. Doesn't need a refill for Valtrex currently.   She has had covid vaccination x 2.   Patient's last menstrual period was 01/11/2016 (approximate).          Sexually active: Yes.    The current method of family planning is post menopausal status.    Exercising: Yes.    Yoga 3-5 x a week Smoker:  no  Health Maintenance: Pap: 01/03/19 WNL HPV Neg,  10-04-15 neg HPV HR neg History of abnormal Pap:  Yes in her early 20's HPV She had cryo surgery.  MMG:  01/25/19 Density B Bi-rads 1 neg, scheduled next month BMD:   none Colonoscopy: 2014 f/u 43yrs  TDaP:  2012 Gardasil: NA   reports that she quit smoking about 10 years ago. Her smoking use included cigarettes. She has never used smokeless tobacco. She reports current alcohol use of about 7.0 - 8.0 standard drinks of alcohol per week. She reports that she does not use drugs. 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   Past Medical History:  Diagnosis Date  . Abnormal Pap smear of cervix 1996  . Abnormal uterine bleeding   . Allergy    seasonal  . Anxiety    past  . Bunion   . Bursitis of hip 1/11   left  . Constipation 12/24/2010  . Fracture  of arm 2004  . Hip pain, left 12/24/2010  . HPV test positive   . HSV-1 (herpes simplex virus 1) infection   . Lipoma   . Lipoma of arm 02/14/2013  . Mononucleosis   . Preventative health care 12/24/2010  . Recurrent oral ulcers 07/22/2011  . Vitamin D deficiency 2-12    Past Surgical History:  Procedure Laterality Date  . APPENDECTOMY  59 yrs old  . CERVICAL BIOPSY  W/ LOOP ELECTRODE EXCISION  1996   LGSIL  . INTRAUTERINE DEVICE INSERTION  09/24/1997   [facility]ard    Current Outpatient Medications  Medication Sig Dispense Refill  . aspirin-acetaminophen-caffeine (EXCEDRIN MIGRAINE) 250-250-65 MG per tablet Take by mouth every 6 (six) hours as needed for headache.    Marland Kitchen CALCIUM PO Take by mouth daily.    . cetirizine (ZYRTEC) 10 MG tablet Take 10 mg by mouth daily.    . Charcoal Activated (ACTIVATED CHARCOAL) POWD 1,200 mg by Does not apply route once.    . Multiple Vitamins-Minerals (MULTIVITAMIN PO) Take by mouth daily.    . Probiotic Product (PROBIOTIC PO) Take by mouth.    . Turmeric 500 MG TABS Take 1,000 mg by mouth.    . valACYclovir (VALTREX) 1000 MG tablet Take  1 tablet (1,000 mg total) by mouth 2 (two) times daily. X 2 days 30 tablet 1   No current facility-administered medications for this visit.    Family History  Problem Relation Age of Onset  . Cancer Mother 74       breast- now bones and brain  . Hypertension Mother   . Heart disease Mother   . Breast cancer Mother   . Cancer Father 72       prostate  . Heart disease Maternal Grandfather   . Stroke Paternal Grandfather        multiple mini strokes  . Tuberculosis Paternal Grandfather   . Osteoporosis Paternal Grandmother   . Colon cancer Neg Hx   Mother diagnosed with breast cancer in her early 50's, she was on HRT.   Review of Systems  Gastrointestinal: Positive for rectal pain. Negative for constipation.  All other systems reviewed and are negative.   Exam:   BP 108/62   Pulse 75   Temp 98.4 F  (36.9 C)   Ht 5\' 3"  (1.6 m)   Wt 137 lb (62.1 kg)   LMP 01/11/2016 (Approximate)   SpO2 98%   BMI 24.27 kg/m   Weight change: @WEIGHTCHANGE @ Height:   Height: 5\' 3"  (160 cm)  Ht Readings from Last 3 Encounters:  01/09/20 5\' 3"  (1.6 m)  01/03/19 5' 3.25" (1.607 m)  07/19/18 5' 3.5" (1.613 m)    General appearance: alert, cooperative and appears stated age Head: Normocephalic, without obvious abnormality, atraumatic Neck: no adenopathy, supple, symmetrical, trachea midline and thyroid normal to inspection and palpation Lungs: clear to auscultation bilaterally Cardiovascular: regular rate and rhythm Breasts: normal appearance, no masses or tenderness Abdomen: soft, non-tender; non distended,  no masses,  no organomegaly Extremities: extremities normal, atraumatic, no cyanosis or edema Skin: Skin color, texture, turgor normal. No rashes or lesions Lymph nodes: Cervical, supraclavicular, and axillary nodes normal. No abnormal inguinal nodes palpated Neurologic: Grossly normal   Pelvic: External genitalia:  no lesions              Urethra:  normal appearing urethra with no masses, tenderness or lesions              Bartholins and Skenes: normal                 Vagina: normal appearing vagina with normal color and discharge, no lesions              Cervix: no lesions               Bimanual Exam:  Uterus:  normal size, contour, position, consistency, mobility, non-tender              Adnexa: no mass, fullness, tenderness               Rectovaginal: Confirms               Anus:  normal sphincter tone, she has a large anal fissure at 1-2 o'clock  [employer] chaperoned for the exam.  A:  Well Woman with normal exam  Constipation  Anal fissure  She has joint pain, family history of rheumatoid arthritis, desires screen.   P:   No pap this year  Colonoscopy UTD  Mammogram next month  TDAP today  Screening labs  Discussed breast self exam  Discussed calcium and vit D  intake  Discussed treating her anal fissure. She has increased her fiber, will try adding magnesium 500  mg a day.

## 2020-01-10 LAB — COMPREHENSIVE METABOLIC PANEL
ALT: 19 IU/L (ref 0–32)
AST: 26 IU/L (ref 0–40)
Albumin/Globulin Ratio: 1.9 (ref 1.2–2.2)
Albumin: 4.5 g/dL (ref 3.8–4.9)
Alkaline Phosphatase: 91 IU/L (ref 48–121)
BUN/Creatinine Ratio: 25 — ABNORMAL HIGH (ref 9–23)
BUN: 16 mg/dL (ref 6–24)
Bilirubin Total: 0.4 mg/dL (ref 0.0–1.2)
CO2: 26 mmol/L (ref 20–29)
Calcium: 9.5 mg/dL (ref 8.7–10.2)
Chloride: 103 mmol/L (ref 96–106)
Creatinine, Ser: 0.65 mg/dL (ref 0.57–1.00)
GFR calc Af Amer: 113 mL/min/{1.73_m2} (ref >59)
GFR calc non Af Amer: 98 mL/min/{1.73_m2} (ref >59)
Globulin, Total: 2.4 g/dL (ref 1.5–4.5)
Glucose: 89 mg/dL (ref 65–99)
Potassium: 4.5 mmol/L (ref 3.5–5.2)
Sodium: 141 mmol/L (ref 134–144)
Total Protein: 6.9 g/dL (ref 6.0–8.5)

## 2020-01-10 LAB — LIPID PANEL
Chol/HDL Ratio: 2.1 ratio (ref 0.0–4.4)
Cholesterol, Total: 210 mg/dL — ABNORMAL HIGH (ref 100–199)
HDL: 100 mg/dL (ref >39)
LDL Chol Calc (NIH): 98 mg/dL (ref 0–99)
Triglycerides: 66 mg/dL (ref 0–149)
VLDL Cholesterol Cal: 12 mg/dL (ref 5–40)

## 2020-01-10 LAB — CBC
Hematocrit: 42 % (ref 34.0–46.6)
Hemoglobin: 14.1 g/dL (ref 11.1–15.9)
MCH: 30.3 pg (ref 26.6–33.0)
MCHC: 33.6 g/dL (ref 31.5–35.7)
MCV: 90 fL (ref 79–97)
Platelets: 278 10*3/uL (ref 150–450)
RBC: 4.66 x10E6/uL (ref 3.77–5.28)
RDW: 12.1 % (ref 11.7–15.4)
WBC: 4.7 10*3/uL (ref 3.4–10.8)

## 2020-01-10 LAB — RHEUMATOID FACTOR: Rhuematoid fact SerPl-aCnc: <10 IU/mL (ref 0.0–13.9)

## 2020-01-10 LAB — VITAMIN D 25 HYDROXY (VIT D DEFICIENCY, FRACTURES): Vit D, 25-Hydroxy: 18 ng/mL — ABNORMAL LOW (ref 30.0–100.0)

## 2020-01-11 ENCOUNTER — Other Ambulatory Visit: Payer: Self-pay | Admitting: Obstetrics and Gynecology

## 2020-01-11 ENCOUNTER — Ambulatory Visit: Payer: PRIVATE HEALTH INSURANCE | Admitting: Obstetrics and Gynecology

## 2020-01-11 DIAGNOSIS — E559 Vitamin D deficiency, unspecified: Secondary | ICD-10-CM

## 2020-02-02 ENCOUNTER — Ambulatory Visit: Payer: PRIVATE HEALTH INSURANCE

## 2020-02-09 ENCOUNTER — Ambulatory Visit
Admission: RE | Admit: 2020-02-09 | Discharge: 2020-02-09 | Disposition: A | Discharge status: Home / Self Care | Payer: PRIVATE HEALTH INSURANCE | Source: Ambulatory Visit | Attending: Obstetrics and Gynecology | Admitting: Obstetrics and Gynecology

## 2020-02-09 ENCOUNTER — Other Ambulatory Visit: Payer: Self-pay

## 2020-02-09 DIAGNOSIS — Z1231 Encounter for screening mammogram for malignant neoplasm of breast: Secondary | ICD-10-CM

## 2020-02-26 ENCOUNTER — Telehealth: Payer: Self-pay | Admitting: Obstetrics and Gynecology

## 2020-02-26 NOTE — Telephone Encounter (Signed)
Return call from patient. Patient scheduled Vit D recheck on 04/12/2020 at 0345PM. Encounter closed.

## 2020-02-26 NOTE — Telephone Encounter (Signed)
Left message to return call to Hayley at [phone] 

## 2020-02-26 NOTE — Telephone Encounter (Signed)
Patient sent the following message via MyChart.  Appointment Request From: Natalie [name]  With Provider: Salvadore Dom, MD [facility]]  Preferred Date Range: Any date 05/14/2020 or later  Preferred Times: Monday Morning, Tuesday Morning, Wednesday Morning, Thursday Morning, Friday Morning  Reason for visit: Request an Appointment  Comments: Vitamin D lab appointment. Follow up for vitamin D lab test only

## 2020-04-12 ENCOUNTER — Other Ambulatory Visit: Payer: Self-pay

## 2020-04-12 ENCOUNTER — Other Ambulatory Visit (INDEPENDENT_AMBULATORY_CARE_PROVIDER_SITE_OTHER): Payer: PRIVATE HEALTH INSURANCE

## 2020-04-12 DIAGNOSIS — E559 Vitamin D deficiency, unspecified: Secondary | ICD-10-CM

## 2020-04-13 LAB — VITAMIN D 25 HYDROXY (VIT D DEFICIENCY, FRACTURES): Vit D, 25-Hydroxy: 41.7 ng/mL (ref 30.0–100.0)

## 2020-12-25 ENCOUNTER — Other Ambulatory Visit: Payer: Self-pay | Admitting: Internal Medicine

## 2020-12-25 ENCOUNTER — Other Ambulatory Visit: Payer: Self-pay | Admitting: Family Medicine

## 2020-12-25 DIAGNOSIS — Z1231 Encounter for screening mammogram for malignant neoplasm of breast: Secondary | ICD-10-CM

## 2021-01-09 ENCOUNTER — Ambulatory Visit: Payer: PRIVATE HEALTH INSURANCE | Admitting: Obstetrics and Gynecology

## 2021-01-16 ENCOUNTER — Other Ambulatory Visit: Payer: Self-pay

## 2021-01-16 ENCOUNTER — Ambulatory Visit (INDEPENDENT_AMBULATORY_CARE_PROVIDER_SITE_OTHER): Payer: No Typology Code available for payment source | Admitting: Obstetrics and Gynecology

## 2021-01-16 ENCOUNTER — Encounter: Payer: Self-pay | Admitting: Obstetrics and Gynecology

## 2021-01-16 VITALS — BP 130/68 | HR 74 | Ht 63.0 in | Wt 141.0 lb

## 2021-01-16 DIAGNOSIS — E559 Vitamin D deficiency, unspecified: Secondary | ICD-10-CM | POA: Diagnosis not present

## 2021-01-16 DIAGNOSIS — Z0000 Encounter for general adult medical examination without abnormal findings: Secondary | ICD-10-CM

## 2021-01-16 DIAGNOSIS — Z01419 Encounter for gynecological examination (general) (routine) without abnormal findings: Secondary | ICD-10-CM | POA: Diagnosis not present

## 2021-01-16 DIAGNOSIS — N393 Stress incontinence (female) (male): Secondary | ICD-10-CM | POA: Diagnosis not present

## 2021-01-16 LAB — URINALYSIS, COMPLETE W/RFL CULTURE
Bacteria, UA: NONE SEEN /HPF
Bilirubin Urine: NEGATIVE
Glucose, UA: NEGATIVE
Hyaline Cast: NONE SEEN /LPF
Ketones, ur: NEGATIVE
Leukocyte Esterase: NEGATIVE
Nitrites, Initial: NEGATIVE
Protein, ur: NEGATIVE
RBC / HPF: NONE SEEN /HPF (ref 0–2)
Specific Gravity, Urine: 1.015 (ref 1.001–1.035)
pH: 7 (ref 5.0–8.0)

## 2021-01-16 NOTE — Progress Notes (Signed)
60 y.o. G1P0010 Married White or Caucasian Not Hispanic or Latino female here for annual exam.  No vaginal bleeding. Sexually active, some dryness which is helped with lubrication. Tolerable vasomotor symptoms.    She has been working more from home and has had some weight gain. She has started to exercise and changed her diet and has started loosing weight.   Having trouble with a bunion and  toe pain which is inhibiting her exercise some.   H/O constipation, started taking magnesium 100 mg prior to bed. Helping her to sleep and to have BM's. Also drinking psyllium. She has a h/o anal fissures and they have healed.   She is having some worsening incontinence with valsalva. She has been working to strengthen her pelvic floor, which has helped some. Leaking a couple of times a week, can be up to 100 cc when she is walking and her dog pulls her.   H/O HSV 1, has valtrex for prn use, rare outbreaks. Doesn't need a refill of valtrex at this time.   Patient's last menstrual period was 01/11/2016 (approximate).          Sexually active: Yes.    The current method of family planning is post menopausal status.    Exercising: Yes.     Yoga  Smoker:  no  Health Maintenance: Pap:  01/03/19 WNL HPV Neg,  10-04-15 neg HPV HR neg History of abnormal Pap:  Yes in her early 20's HPV She had cryo surgery.  MMG:  02/09/20 density C Bi-rads 1 neg  BMD:   None  Colonoscopy: 2014 f/u 10 years  TDaP:  01/09/20  Gardasil: NA   reports that she quit smoking about 11 years ago. Her smoking use included cigarettes. She has never used smokeless tobacco. She reports current alcohol use of about 7.0 - 8.0 standard drinks of alcohol per week. She reports that she does not use drugs. 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, fast food  Past Medical History:  Diagnosis Date   Abnormal Pap smear of cervix 1996   Abnormal uterine bleeding    Allergy    seasonal   Anxiety    past   Bunion    Bursitis of hip 1/11    left   Constipation 12/24/2010   Fracture of arm 2004   Hip pain, left 12/24/2010   HPV test positive    HSV-1 (herpes simplex virus 1) infection    Lipoma    Lipoma of arm 02/14/2013   Mononucleosis    Preventative health care 12/24/2010   Recurrent oral ulcers 07/22/2011   Vitamin D deficiency 2-12    Past Surgical History:  Procedure Laterality Date   APPENDECTOMY  60 yrs old   CERVICAL BIOPSY  W/ LOOP ELECTRODE EXCISION  1996   LGSIL   INTRAUTERINE DEVICE INSERTION  09/24/1997   Paraguard    Current Outpatient Medications  Medication Sig Dispense Refill   aspirin-acetaminophen-caffeine (EXCEDRIN MIGRAINE) 250-250-65 MG per tablet Take by mouth every 6 (six) hours as needed for headache.     CALCIUM PO Take by mouth daily.     cetirizine (ZYRTEC) 10 MG tablet Take 10 mg by mouth daily.     Multiple Vitamins-Minerals (MULTIVITAMIN PO) Take by mouth daily.     Probiotic Product (PROBIOTIC PO) Take by mouth.     Turmeric 500 MG TABS Take 1,000 mg by mouth.     valACYclovir (VALTREX) 1000 MG tablet Take 1 tablet (1,000 mg total)  by mouth 2 (two) times daily. X 2 days 30 tablet 1   No current facility-administered medications for this visit.    Family History  Problem Relation Age of Onset   Cancer Mother 93       breast- now bones and brain   Hypertension Mother    Heart disease Mother    Breast cancer Mother    Cancer Father 84       prostate   Heart disease Maternal Grandfather    Stroke Paternal Grandfather        multiple mini strokes   Tuberculosis Paternal Grandfather    Osteoporosis Paternal Grandmother    Colon cancer Neg Hx     Review of Systems  All other systems reviewed and are negative.  Exam:   BP 130/68   Pulse 74   Ht 5\' 3"  (1.6 m)   Wt 141 lb (64 kg)   LMP 01/11/2016 (Approximate)   SpO2 99%   BMI 24.98 kg/m   Weight change: @WEIGHTCHANGE @ Height:   Height: 5\' 3"  (160 cm)  Ht Readings from Last 3 Encounters:  01/16/21 5\' 3"  (1.6 m)   01/09/20 5\' 3"  (1.6 m)  01/03/19 5' 3.25" (1.607 m)    General appearance: alert, cooperative and appears stated age Head: Normocephalic, without obvious abnormality, atraumatic Neck: no adenopathy, supple, symmetrical, trachea midline and thyroid normal to inspection and palpation Lungs: clear to auscultation bilaterally Cardiovascular: regular rate and rhythm Breasts: normal appearance, no masses or tenderness Abdomen: soft, non-tender; non distended,  no masses,  no organomegaly Extremities: extremities normal, atraumatic, no cyanosis or edema Skin: Skin color, texture, turgor normal. No rashes or lesions Lymph nodes: Cervical, supraclavicular, and axillary nodes normal. No abnormal inguinal nodes palpated Neurologic: Grossly normal   Pelvic: External genitalia:  no lesions              Urethra:  normal appearing urethra with no masses, tenderness or lesions              Bartholins and Skenes: normal                 Vagina: normal appearing vagina with normal color and discharge, no lesions              Cervix: no lesions               Bimanual Exam:  Uterus:  normal size, contour, position, consistency, mobility, non-tender              Adnexa: no mass, fullness, tenderness               Rectovaginal: Confirms               Anus:  normal sphincter tone, no lesions  Gae Dry chaperoned for the exam.  1. Well woman exam Discussed breast self exam Discussed calcium and vit D intake Mammogram scheduled No pap this year Colonoscopy UTD  2. GSI (genuine stress incontinence), female She is working on pelvic floor exercises. Discussed options of PT and surgery. - Urinalysis,Complete w/RFL Culture  3. Vitamin D deficiency - VITAMIN D 25 Hydroxy (Vit-D Deficiency, Fractures)  4. Laboratory exam ordered as part of routine general medical examination Not fasting, she had a small breakfast 6.5 hours ago - CBC - Comprehensive metabolic panel - Lipid panel

## 2021-01-16 NOTE — Patient Instructions (Signed)

## 2021-01-17 LAB — CBC
HCT: 42 % (ref 35.0–45.0)
Hemoglobin: 13.9 g/dL (ref 11.7–15.5)
MCH: 31 pg (ref 27.0–33.0)
MCHC: 33.1 g/dL (ref 32.0–36.0)
MCV: 93.5 fL (ref 80.0–100.0)
MPV: 9.3 fL (ref 7.5–12.5)
Platelets: 328 10*3/uL (ref 140–400)
RBC: 4.49 10*6/uL (ref 3.80–5.10)
RDW: 12 % (ref 11.0–15.0)
WBC: 4.4 10*3/uL (ref 3.8–10.8)

## 2021-01-17 LAB — COMPREHENSIVE METABOLIC PANEL
AG Ratio: 2 (calc) (ref 1.0–2.5)
ALT: 16 U/L (ref 6–29)
AST: 20 U/L (ref 10–35)
Albumin: 4.7 g/dL (ref 3.6–5.1)
Alkaline phosphatase (APISO): 84 U/L (ref 37–153)
BUN: 22 mg/dL (ref 7–25)
CO2: 28 mmol/L (ref 20–32)
Calcium: 9.6 mg/dL (ref 8.6–10.4)
Chloride: 102 mmol/L (ref 98–110)
Creat: 0.63 mg/dL (ref 0.50–1.05)
Globulin: 2.4 g/dL (calc) (ref 1.9–3.7)
Glucose, Bld: 80 mg/dL (ref 65–99)
Potassium: 4.4 mmol/L (ref 3.5–5.3)
Sodium: 139 mmol/L (ref 135–146)
Total Bilirubin: 0.7 mg/dL (ref 0.2–1.2)
Total Protein: 7.1 g/dL (ref 6.1–8.1)

## 2021-01-17 LAB — LIPID PANEL
Cholesterol: 240 mg/dL — ABNORMAL HIGH (ref <200)
HDL: 106 mg/dL (ref >=50)
LDL Cholesterol (Calc): 122 mg/dL (calc) — ABNORMAL HIGH
Non-HDL Cholesterol (Calc): 134 mg/dL (calc) — ABNORMAL HIGH (ref <130)
Total CHOL/HDL Ratio: 2.3 (calc) (ref <5.0)
Triglycerides: 41 mg/dL (ref <150)

## 2021-01-17 LAB — VITAMIN D 25 HYDROXY (VIT D DEFICIENCY, FRACTURES): Vit D, 25-Hydroxy: 37 ng/mL (ref 30–100)

## 2021-02-21 ENCOUNTER — Ambulatory Visit
Admission: RE | Admit: 2021-02-21 | Discharge: 2021-02-21 | Disposition: A | Discharge status: Home / Self Care | Payer: No Typology Code available for payment source | Source: Ambulatory Visit | Attending: Family Medicine | Admitting: Family Medicine

## 2021-02-21 ENCOUNTER — Other Ambulatory Visit: Payer: Self-pay

## 2021-02-21 DIAGNOSIS — Z1231 Encounter for screening mammogram for malignant neoplasm of breast: Secondary | ICD-10-CM

## 2021-03-06 ENCOUNTER — Other Ambulatory Visit: Payer: Self-pay

## 2021-03-06 ENCOUNTER — Encounter: Payer: Self-pay | Admitting: Obstetrics and Gynecology

## 2021-03-06 ENCOUNTER — Telehealth: Payer: Self-pay | Admitting: *Deleted

## 2021-03-06 ENCOUNTER — Ambulatory Visit: Payer: No Typology Code available for payment source | Admitting: Obstetrics and Gynecology

## 2021-03-06 VITALS — BP 122/78

## 2021-03-06 DIAGNOSIS — N3090 Cystitis, unspecified without hematuria: Secondary | ICD-10-CM | POA: Diagnosis not present

## 2021-03-06 MED ORDER — SULFAMETHOXAZOLE-TRIMETHOPRIM 800-160 MG PO TABS: 1.0000 | ORAL_TABLET | Freq: Two times a day (BID) | ORAL | 0 refills | Status: DC

## 2021-03-06 MED ORDER — PHENAZOPYRIDINE HCL 200 MG PO TABS: 200.0000 mg | ORAL_TABLET | Freq: Three times a day (TID) | ORAL | 0 refills | Status: DC | PRN

## 2021-03-06 NOTE — Progress Notes (Signed)
GYNECOLOGY  VISIT   HPI: 60 y.o.   Married White or Caucasian Not Hispanic or Latino  female   G1P0010 with Patient's last menstrual period was 01/11/2016 (approximate).   here for UTI symptoms. Patient reports that she had burning with urination.  Symptoms started mildly 2 days ago. Yesterday she had some hematuria. She is having urinary frequency, urgency and dysuria. Slight SP discomfort. No fever.   GYNECOLOGIC HISTORY: Patient's last menstrual period was 01/11/2016 (approximate). Contraception:PMP Menopausal hormone therapy: none         OB History     Gravida  1   Para  0   Term      Preterm      AB  1   Living  0      SAB      IAB  1   Ectopic      Multiple      Live Births                 Patient Active Problem List   Diagnosis Date Noted   Keratoconjunctivitis sicca not specified as Sjogren's, bilateral 03/28/2018   Myopia with presbyopia of both eyes 03/28/2018   Hallux rigidus, right foot 10/05/2017   Hallux valgus, right 10/05/2017   Right foot pain 10/05/2017   Sore throat and laryngitis 12/01/2013   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   Lipoma of arm 02/14/2013   Recurrent oral ulcers 07/22/2011   Hip pain, left 12/24/2010   Preventative health care 12/24/2010   OTH&UNSPEC SUPERFICIAL INJURY FOOT&TOES INFECTED 07/27/2009    Past Medical History:  Diagnosis Date   Abnormal Pap smear of cervix 1996   Abnormal uterine bleeding    Allergy    seasonal   Anxiety    past   Bunion    Bursitis of hip 1/11   left   Constipation 12/24/2010   Fracture of arm 2004   Hip pain, left 12/24/2010   HPV test positive    HSV-1 (herpes simplex virus 1) infection    Lipoma    Lipoma of arm 02/14/2013   Mononucleosis    Preventative health care 12/24/2010   Recurrent oral ulcers 07/22/2011   Vitamin D deficiency 2-12    Past Surgical History:  Procedure Laterality Date   APPENDECTOMY  60 yrs old   CERVICAL BIOPSY  W/ LOOP ELECTRODE EXCISION   1996   LGSIL   INTRAUTERINE DEVICE INSERTION  09/24/1997   Paraguard    Current Outpatient Medications  Medication Sig Dispense Refill   aspirin-acetaminophen-caffeine (EXCEDRIN MIGRAINE) 250-250-65 MG per tablet Take by mouth every 6 (six) hours as needed for headache.     CALCIUM PO Take by mouth daily.     cetirizine (ZYRTEC) 10 MG tablet Take 10 mg by mouth daily.     Multiple Vitamins-Minerals (MULTIVITAMIN PO) Take by mouth daily.     Probiotic Product (PROBIOTIC PO) Take by mouth.     valACYclovir (VALTREX) 1000 MG tablet Take 1 tablet (1,000 mg total) by mouth 2 (two) times daily. X 2 days 30 tablet 1   Turmeric 500 MG TABS Take 1,000 mg by mouth. (Patient not taking: Reported on 03/06/2021)     No current facility-administered medications for this visit.     ALLERGIES: Patient has no known allergies.  Family History  Problem Relation Age of Onset   Cancer Mother 70       breast- now bones and brain   Hypertension Mother    Heart  disease Mother    Breast cancer Mother    Cancer Father 10       prostate   Heart disease Maternal Grandfather    Stroke Paternal Grandfather        multiple mini strokes   Tuberculosis Paternal Grandfather    Osteoporosis Paternal Grandmother    Colon cancer Neg Hx     Social History   Socioeconomic History   Marital status: Married    Spouse name: Not on file   Number of children: Not on file   Years of education: Not on file   Highest education level: Not on file  Occupational History   Not on file  Tobacco Use   Smoking status: Former    Types: Cigarettes    Quit date: 07/09/2009    Years since quitting: 11.6   Smokeless tobacco: Never  Vaping Use   Vaping Use: Never used  Substance and Sexual Activity   Alcohol use: Yes    Alcohol/week: 7.0 - 8.0 standard drinks    Types: 7 - 8 Standard drinks or equivalent per week   Drug use: No   Sexual activity: Yes    Partners: Male    Birth control/protection: Post-menopausal     Comment: husband vasectomy  Other Topics Concern   Not on file  Social History Narrative   Not on file   Social Determinants of Health   Financial Resource Strain: Not on file  Food Insecurity: Not on file  Transportation Needs: Not on file  Physical Activity: Not on file  Stress: Not on file  Social Connections: Not on file  Intimate Partner Violence: Not on file    Review of Systems  Genitourinary:  Positive for dysuria.   PHYSICAL EXAMINATION:    BP 122/78   LMP 01/11/2016 (Approximate)    Temp: 98.6 General appearance: alert, cooperative and appears stated age Abdomen: soft, non-tender; non distended, no masses,  no organomegaly CVA: not tender  1. Cystitis - Urinalysis,Complete w/RFL Culture - sulfamethoxazole-trimethoprim (BACTRIM DS) 800-160 MG tablet; Take 1 tablet by mouth 2 (two) times daily. One PO BID x 3 days  Dispense: 6 tablet; Refill: 0 - phenazopyridine (PYRIDIUM) 200 MG tablet; Take 1 tablet (200 mg total) by mouth 3 (three) times daily as needed for pain.  Dispense: 6 tablet; Refill: 0

## 2021-03-06 NOTE — Patient Instructions (Signed)
Urinary Tract Infection, Adult A urinary tract infection (UTI) is an infection of any part of the urinary tract. The urinary tract includes the kidneys, ureters, bladder, and urethra. These organs make, store, and get rid of urine in the body. An upper UTI affects the ureters and kidneys. A lower UTI affects the bladder and urethra. What are the causes? Most urinary tract infections are caused by bacteria in your genital area around your urethra, where urine leaves your body. These bacteria grow and cause inflammation of your urinary tract. What increases the risk? You are more likely to develop this condition if: You have a urinary catheter that stays in place. You are not able to control when you urinate or have a bowel movement (incontinence). You are female and you: Use a spermicide or diaphragm for birth control. Have low estrogen levels. Are pregnant. You have certain genes that increase your risk. You are sexually active. You take antibiotic medicines. You have a condition that causes your flow of urine to slow down, such as: An enlarged prostate, if you are female. Blockage in your urethra. A kidney stone. A nerve condition that affects your bladder control (neurogenic bladder). Not getting enough to drink, or not urinating often. You have certain medical conditions, such as: Diabetes. A weak disease-fighting system (immunesystem). Sickle cell disease. Gout. Spinal cord injury. What are the signs or symptoms? Symptoms of this condition include: Needing to urinate right away (urgency). Frequent urination. This may include small amounts of urine each time you urinate. Pain or burning with urination. Blood in the urine. Urine that smells bad or unusual. Trouble urinating. Cloudy urine. Vaginal discharge, if you are female. Pain in the abdomen or the lower back. You may also have: Vomiting or a decreased appetite. Confusion. Irritability or tiredness. A fever or  chills. Diarrhea. The first symptom in older adults may be confusion. In some cases, they may not have any symptoms until the infection has worsened. How is this diagnosed? This condition is diagnosed based on your medical history and a physical exam. You may also have other tests, including: Urine tests. Blood tests. Tests for STIs (sexually transmitted infections). If you have had more than one UTI, a cystoscopy or imaging studies may be done to determine the cause of the infections. How is this treated? Treatment for this condition includes: Antibiotic medicine. Over-the-counter medicines to treat discomfort. Drinking enough water to stay hydrated. If you have frequent infections or have other conditions such as a kidney stone, you may need to see a health care provider who specializes in the urinary tract (urologist). In rare cases, urinary tract infections can cause sepsis. Sepsis is a life-threatening condition that occurs when the body responds to an infection. Sepsis is treated in the hospital with IV antibiotics, fluids, and other medicines. Follow these instructions at home: Medicines Take over-the-counter and prescription medicines only as told by your health care provider. If you were prescribed an antibiotic medicine, take it as told by your health care provider. Do not stop using the antibiotic even if you start to feel better. General instructions Make sure you: Empty your bladder often and completely. Do not hold urine for long periods of time. Empty your bladder after sex. Wipe from front to back after urinating or having a bowel movement if you are female. Use each tissue only one time when you wipe. Drink enough fluid to keep your urine pale yellow. Keep all follow-up visits. This is important. Contact a health care provider   if: Your symptoms do not get better after 1-2 days. Your symptoms go away and then return. Get help right away if: You have severe pain in your  back or your lower abdomen. You have a fever or chills. You have nausea or vomiting. Summary A urinary tract infection (UTI) is an infection of any part of the urinary tract, which includes the kidneys, ureters, bladder, and urethra. Most urinary tract infections are caused by bacteria in your genital area. Treatment for this condition often includes antibiotic medicines. If you were prescribed an antibiotic medicine, take it as told by your health care provider. Do not stop using the antibiotic even if you start to feel better. Keep all follow-up visits. This is important. This information is not intended to replace advice given to you by your health care provider. Make sure you discuss any questions you have with your health care provider. Document Revised: 02/09/2020 Document Reviewed: 02/09/2020 Elsevier Patient Education  2022 [employer]  

## 2021-03-06 NOTE — Telephone Encounter (Signed)
Patient called c/o UTI symptoms started yesterday burning with urination , last night she noticed blood in urine, also reports lower back pain. She does not have fever, chills. She is scheduled for tomorrow at 4:15pm, can come today as a work in if possible? Please advise

## 2021-03-06 NOTE — Telephone Encounter (Signed)
Spoke with Dr.Jertson and will work patient in today at 10:45am, patient aware. Appointment tomorrow cancelled.

## 2021-03-07 ENCOUNTER — Ambulatory Visit: Payer: No Typology Code available for payment source | Admitting: Obstetrics and Gynecology

## 2021-03-08 ENCOUNTER — Encounter: Payer: Self-pay | Admitting: Obstetrics and Gynecology

## 2021-03-08 LAB — URINALYSIS, COMPLETE W/RFL CULTURE
Bilirubin Urine: NEGATIVE
Calcium Oxalate Crystal: NONE SEEN /HPF
Casts: NONE SEEN /LPF
Crystals: NONE SEEN /HPF
Glucose, UA: NEGATIVE
Hyaline Cast: NONE SEEN /LPF
Ketones, ur: NEGATIVE
Nitrites, Initial: NEGATIVE
Protein, ur: NEGATIVE
Specific Gravity, Urine: 1.015 (ref 1.001–1.035)
Yeast: NONE SEEN /HPF
pH: 7.5 (ref 5.0–8.0)

## 2021-03-08 LAB — URINE CULTURE
MICRO NUMBER:: 12290820
SPECIMEN QUALITY:: ADEQUATE

## 2021-03-08 LAB — CULTURE INDICATED

## 2021-10-22 IMAGING — MG MM DIGITAL SCREENING BILAT W/ TOMO AND CAD
8 series · 9 of 24 positions shown · non-contrast
Comparison: Previous exam(s).

CLINICAL DATA: Screening.

EXAM:
DIGITAL SCREENING BILATERAL MAMMOGRAM WITH TOMOSYNTHESIS AND CAD
TECHNIQUE: Bilateral screening digital craniocaudal and mediolateral oblique
mammograms were obtained. Bilateral screening digital breast
tomosynthesis was performed. The images were evaluated with
computer-aided detection.

[R CC synth-2D]
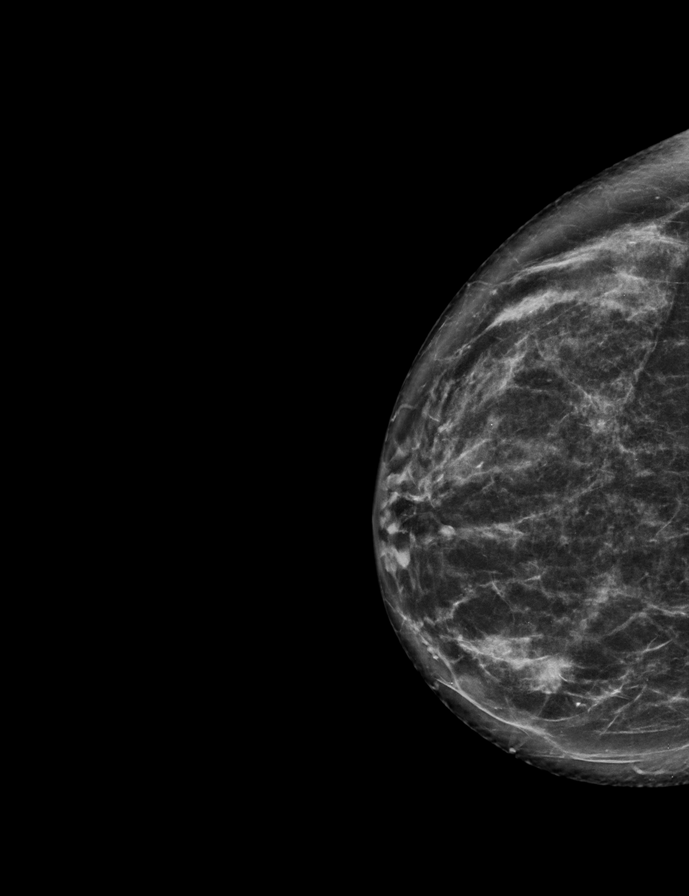

[L CC synth-2D]
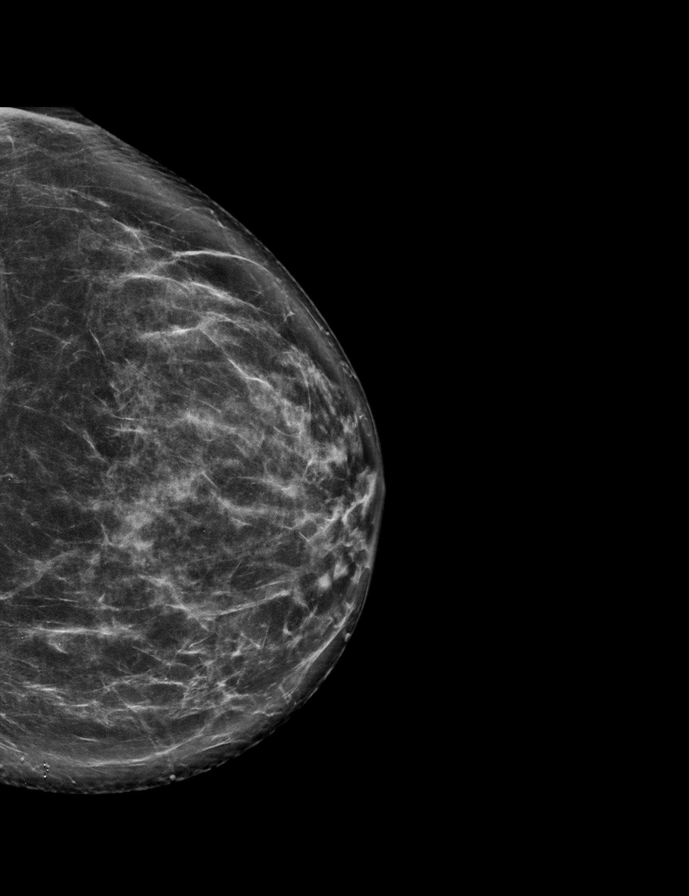

[L MLO synth-2D]
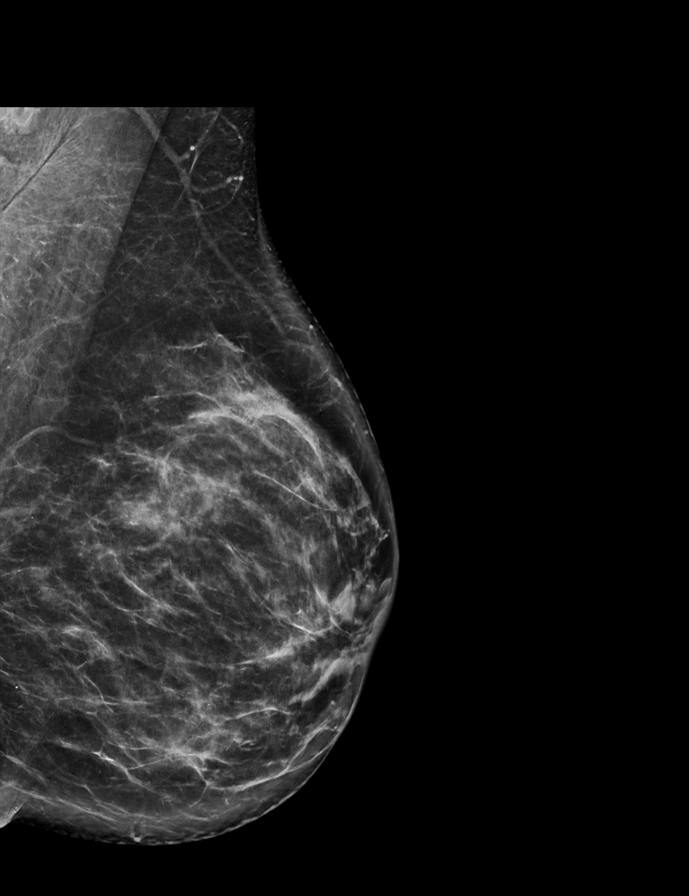

[R MLO synth-2D]
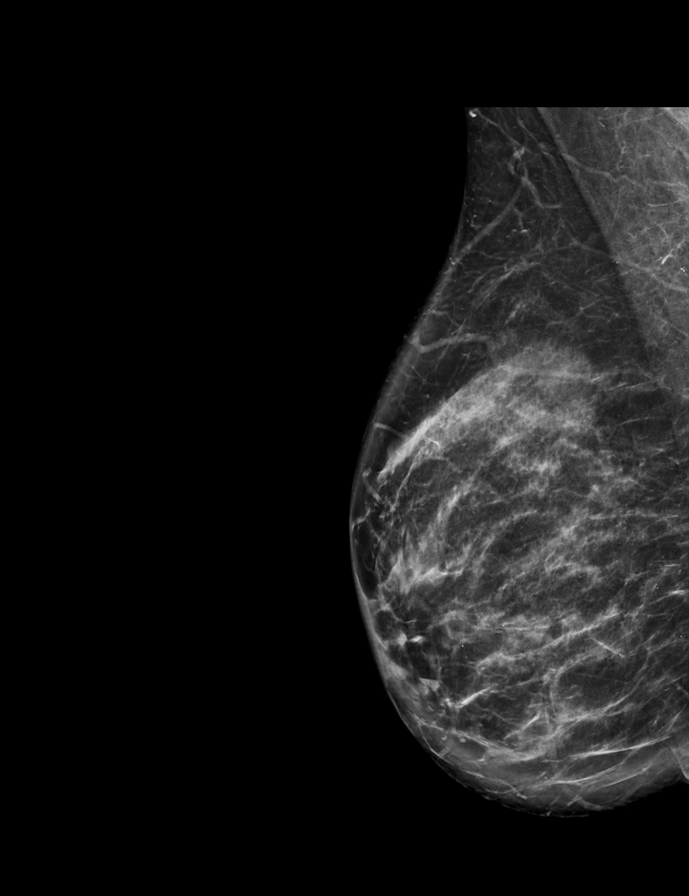

[R CC tomo · 2 of 74 frames shown]
[frame 24/74]
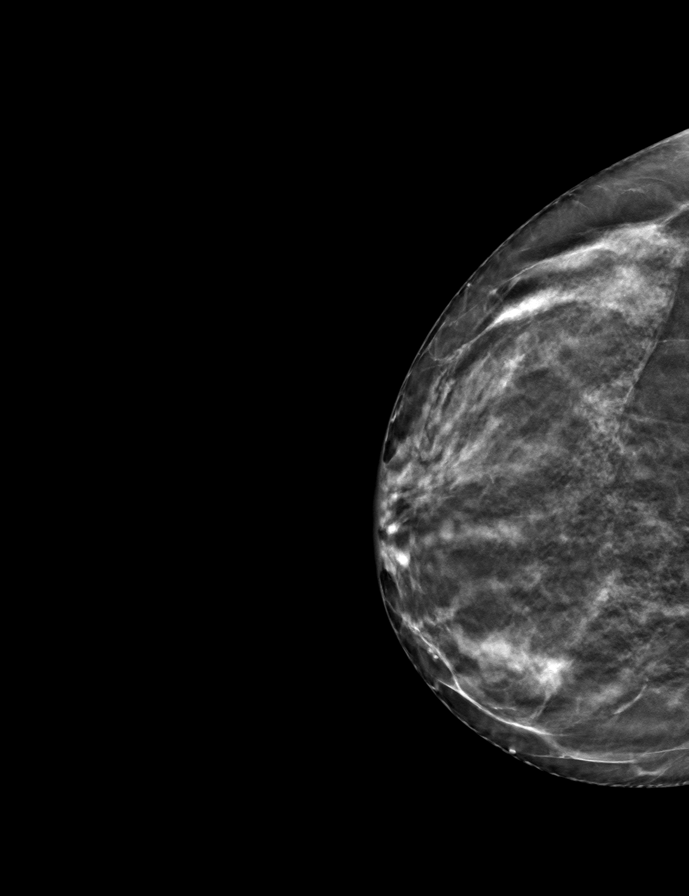
[frame 37/74]
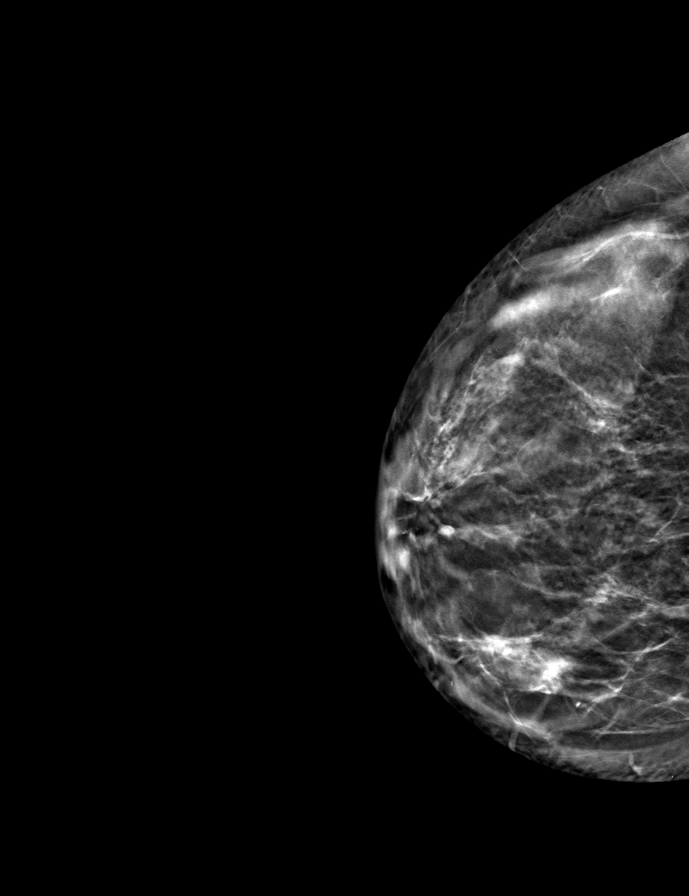

[R MLO tomo · tomo slice 41/81.0]
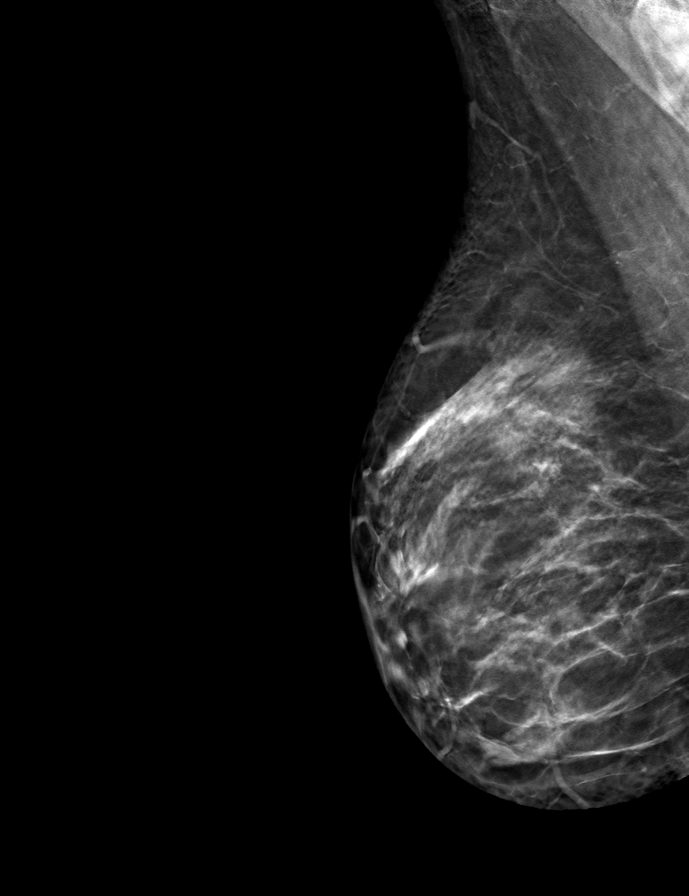

[L CC tomo · tomo slice 41/81.0]
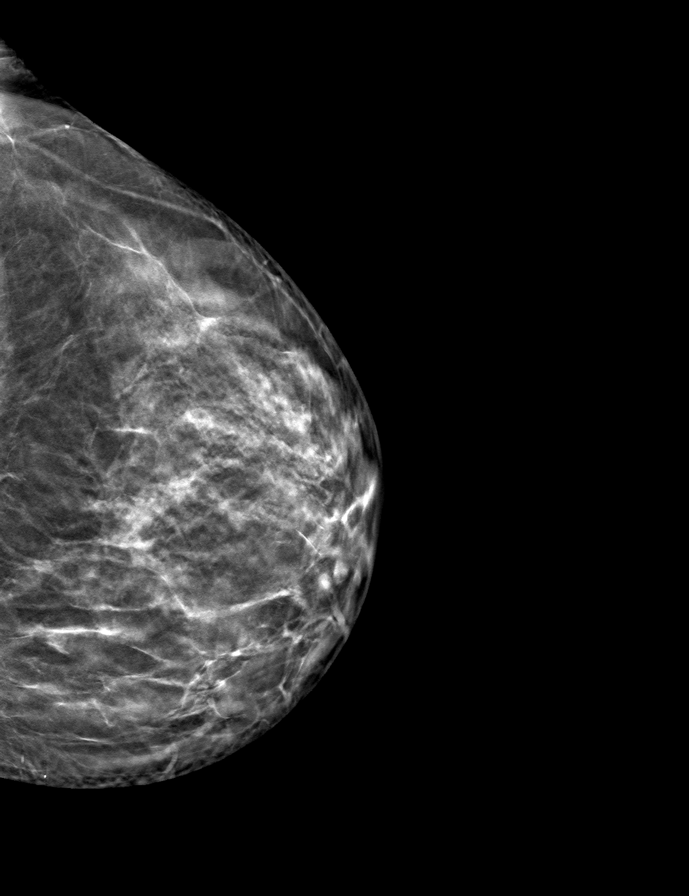

[L MLO tomo · tomo slice 41/80.0]
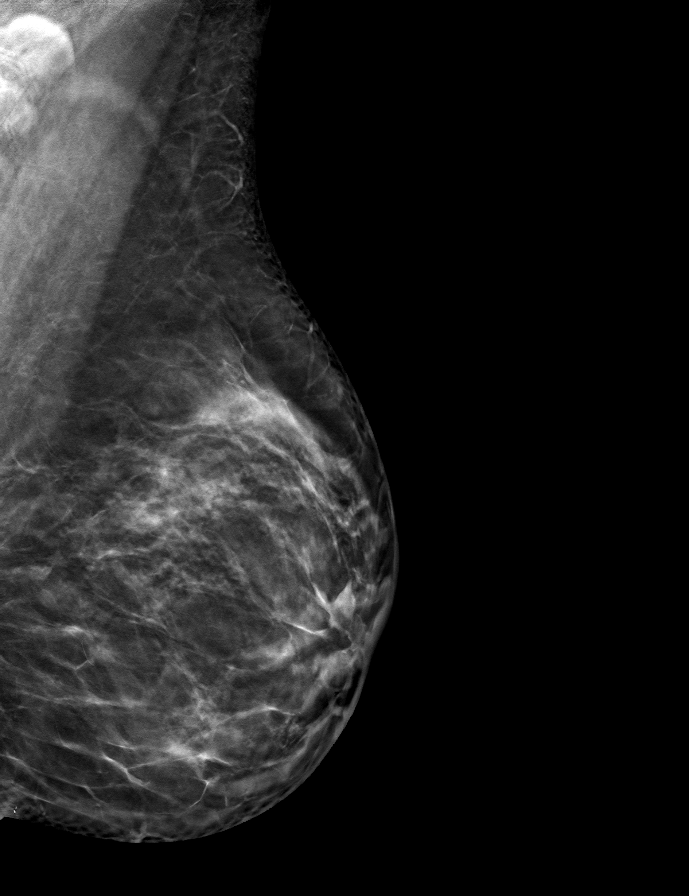

[9 of 24 positions shown; findings below may reference images not displayed]

ACR Breast Density Category c: The breast tissue is heterogeneously
dense, which may obscure small masses.
FINDINGS: There are no findings suspicious for malignancy.
IMPRESSION: No mammographic evidence of malignancy. A result letter of this
screening mammogram will be mailed directly to the patient.

RECOMMENDATION:
Screening mammogram in one year. (Code:Q3-W-BC3)

BI-RADS CATEGORY  1: Negative.

## 2021-11-18 ENCOUNTER — Other Ambulatory Visit: Payer: Self-pay | Admitting: Obstetrics and Gynecology

## 2021-11-18 DIAGNOSIS — Z1231 Encounter for screening mammogram for malignant neoplasm of breast: Secondary | ICD-10-CM

## 2022-01-15 NOTE — Progress Notes (Unsigned)
61 y.o. G36P0010 Married White or Caucasian Not Hispanic or Latino female here for annual exam.  Vaginal dryness, helped with lubricant. Only entry dyspareunia, improved with lubricant.   Intermittent issues with anal fissures, improved.  Mild GSI, stable and tolerable. She bought some pelvic weights. Will restart Yoga.   Stressful year.     Patient's last menstrual period was 01/11/2016 (approximate).          Sexually active: Yes.    The current method of family planning is post menopausal status.    Exercising: Yes.     Yoga  Smoker:  yes about 1 pack a week. She had quit for 10 years, restarted with increased stress in 12/22.   Health Maintenance: Pap:  01/03/19 WNL HPV Neg,  10-04-15 neg HPV HR neg History of abnormal Pap:  Yes in her early 20's HPV She had cryo surgery.  MMG:  02/21/21 density C Bi-rds Bi-rads 1 neg  BMD:   n/a Colonoscopy: 2014 f/u 10 years  TDaP:  01/09/20 Gardasil: n/a   reports that she quit smoking about 12 years ago. Her smoking use included cigarettes. She has never used smokeless tobacco. She reports current alcohol use of about 7.0 - 8.0 standard drinks of alcohol per week. She reports that she does not use drugs. 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  She has 2 golden retrieve dog.   Past Medical History:  Diagnosis Date   Abnormal Pap smear of cervix 1996   Abnormal uterine bleeding    Allergy    seasonal   Anxiety    past   Bunion    Bursitis of hip 1/11   left   Constipation 12/24/2010   Fracture of arm 2004   Hip pain, left 12/24/2010   HPV test positive    HSV-1 (herpes simplex virus 1) infection    Lipoma    Lipoma of arm 02/14/2013   Mononucleosis    Preventative health care 12/24/2010   Recurrent oral ulcers 07/22/2011   Vitamin D deficiency 2-12    Past Surgical History:  Procedure Laterality Date   APPENDECTOMY  61 yrs old   CERVICAL BIOPSY  W/ LOOP ELECTRODE EXCISION  1996   LGSIL   INTRAUTERINE DEVICE INSERTION   09/24/1997   Paraguard    Current Outpatient Medications  Medication Sig Dispense Refill   aspirin-acetaminophen-caffeine (EXCEDRIN MIGRAINE) 250-250-65 MG per tablet Take by mouth every 6 (six) hours as needed for headache.     cetirizine (ZYRTEC) 10 MG tablet Take 10 mg by mouth daily.     valACYclovir (VALTREX) 1000 MG tablet Take 1 tablet (1,000 mg total) by mouth 2 (two) times daily. X 2 days 30 tablet 1   No current facility-administered medications for this visit.    Family History  Problem Relation Age of Onset   Cancer Mother 15       breast- now bones and brain   Hypertension Mother    Heart disease Mother    Breast cancer Mother    Cancer Father 92       prostate   Heart disease Maternal Grandfather    Stroke Paternal Grandfather        multiple mini strokes   Tuberculosis Paternal Grandfather    Osteoporosis Paternal Grandmother    Colon cancer Neg Hx     Review of Systems  All other systems reviewed and are negative.   Exam:   BP 122/72   Pulse 66  Ht '5\' 4"'$  (1.626 m)   Wt 138 lb (62.6 kg)   LMP 01/11/2016 (Approximate)   SpO2 100%   BMI 23.69 kg/m   Weight change: '@WEIGHTCHANGE'$ @ Height:   Height: '5\' 4"'$  (162.6 cm)  Ht Readings from Last 3 Encounters:  01/20/22 '5\' 4"'$  (1.626 m)  01/16/21 '5\' 3"'$  (1.6 m)  01/09/20 '5\' 3"'$  (1.6 m)    General appearance: alert, cooperative and appears stated age Head: Normocephalic, without obvious abnormality, atraumatic Neck: no adenopathy, supple, symmetrical, trachea midline and thyroid {CHL AMB PHY EX THYROID NORM DEFAULT:[phone]::"normal to inspection and palpation"} Lungs: clear to auscultation bilaterally Cardiovascular: regular rate and rhythm Breasts: {Exam; breast:13139::"normal appearance, no masses or tenderness"} Abdomen: soft, non-tender; non distended,  no masses,  no organomegaly Extremities: extremities normal, atraumatic, no cyanosis or edema Skin: Skin color, texture, turgor normal. No rashes or  lesions Lymph nodes: Cervical, supraclavicular, and axillary nodes normal. No abnormal inguinal nodes palpated Neurologic: Grossly normal   Pelvic: External genitalia:  no lesions              Urethra:  normal appearing urethra with no masses, tenderness or lesions              Bartholins and Skenes: normal                 Vagina: normal appearing vagina with normal color and discharge, no lesions              Cervix: {CHL AMB PHY EX CERVIX NORM DEFAULT:[phone]::"no lesions"}               Bimanual Exam:  Uterus:  {CHL AMB PHY EX UTERUS NORM DEFAULT:[phone]::"normal size, contour, position, consistency, mobility, non-tender"}              Adnexa: {CHL AMB PHY EX ADNEXA NO MASS DEFAULT:[phone]::"no mass, fullness, tenderness"}               Rectovaginal: Confirms               Anus:  normal sphincter tone, no lesions  *** chaperoned for the exam.  1. Well woman exam Discussed breast self exam Discussed calcium and vit D intake Mammogram is scheduled Colonoscopy due next year No pap this year No screening labs this year  2. Elevated LDL cholesterol level - Lipid panel  3. History of cold sores - valACYclovir (VALTREX) 1000 MG tablet; Take 1 tablet (1,000 mg total) by mouth 2 (two) times daily. X 2 days  Dispense: 30 tablet; Refill: 1   Vaginal dryness Try uberlube Consider vaginal estrogen

## 2022-01-20 ENCOUNTER — Ambulatory Visit (INDEPENDENT_AMBULATORY_CARE_PROVIDER_SITE_OTHER): Payer: PRIVATE HEALTH INSURANCE | Admitting: Obstetrics and Gynecology

## 2022-01-20 ENCOUNTER — Encounter: Payer: Self-pay | Admitting: Obstetrics and Gynecology

## 2022-01-20 VITALS — BP 122/72 | HR 66 | Ht 64.0 in | Wt 138.0 lb

## 2022-01-20 DIAGNOSIS — E7800 Pure hypercholesterolemia, unspecified: Secondary | ICD-10-CM

## 2022-01-20 DIAGNOSIS — Z01419 Encounter for gynecological examination (general) (routine) without abnormal findings: Secondary | ICD-10-CM | POA: Diagnosis not present

## 2022-01-20 DIAGNOSIS — N898 Other specified noninflammatory disorders of vagina: Secondary | ICD-10-CM

## 2022-01-20 DIAGNOSIS — Z8619 Personal history of other infectious and parasitic diseases: Secondary | ICD-10-CM

## 2022-01-20 MED ORDER — VALACYCLOVIR HCL 1 G PO TABS: 1000.0000 mg | ORAL_TABLET | Freq: Two times a day (BID) | ORAL | 1 refills | Status: DC

## 2022-01-20 NOTE — Patient Instructions (Addendum)
Counselor: Chele Fleming [phone]  EXERCISE   We recommended that you start or continue a regular exercise program for good health. Physical activity is anything that gets your body moving, some is better than none. The CDC recommends 150 minutes per week of Moderate-Intensity Aerobic Activity and 2 or more days of Muscle Strengthening Activity.  Benefits of exercise are limitless: helps weight loss/weight maintenance, improves mood and energy, helps with depression and anxiety, improves sleep, tones and strengthens muscles, improves balance, improves bone density, protects from chronic conditions such as heart disease, high blood pressure and diabetes and so much more. To learn more visit: https://www.cdc.gov/physicalactivity/index.html  DIET: Good nutrition starts with a healthy diet of fruits, vegetables, whole grains, and lean protein sources. Drink plenty of water for hydration. Minimize empty calories, sodium, sweets. For more information about dietary recommendations visit: https://health.gov/our-work/nutrition-physical-activity/dietary-guidelines and https://www.myplate.gov/  ALCOHOL:  Women should limit their alcohol intake to no more than 7 drinks/beers/glasses of wine (combined, not each!) per week. Moderation of alcohol intake to this level decreases your risk of breast cancer and liver damage.  If you are concerned that you may have a problem, or your friends have told you they are concerned about your drinking, there are many resources to help. A well-known program that is free, effective, and available to all people all over the nation is Alcoholics Anonymous.  Check out this site to learn more: https://www.aa.org/   CALCIUM AND VITAMIN D:  Adequate intake of calcium and Vitamin D are recommended for bone health.  You should be getting between 1000-1200 mg of calcium and 800 units of Vitamin D daily between diet and supplements  PAP SMEARS:  Pap smears, to check for cervical cancer  or precancers,  have traditionally been done yearly, scientific advances have shown that most women can have pap smears less often.  However, every woman still should have a physical exam from her gynecologist every year. It will include a breast check, inspection of the vulva and vagina to check for abnormal growths or skin changes, a visual exam of the cervix, and then an exam to evaluate the size and shape of the uterus and ovaries. We will also provide age appropriate advice regarding health maintenance, like when you should have certain vaccines, screening for sexually transmitted diseases, bone density testing, colonoscopy, mammograms, etc.   MAMMOGRAMS:  All women over 40 years old should have a routine mammogram.   COLON CANCER SCREENING: Now recommend starting at age 45. At this time colonoscopy is not covered for routine screening until 50. There are take home tests that can be done between 45-49.   COLONOSCOPY:  Colonoscopy to screen for colon cancer is recommended for all women at age 50.  We know, you hate the idea of the prep.  We agree, BUT, having colon cancer and not knowing it is worse!!  Colon cancer so often starts as a polyp that can be seen and removed at colonscopy, which can quite literally save your life!  And if your first colonoscopy is normal and you have no family history of colon cancer, most women don't have to have it again for 10 years.  Once every ten years, you can do something that may end up saving your life, right?  We will be happy to help you get it scheduled when you are ready.  Be sure to check your insurance coverage so you understand how much it will cost.  It may be covered as a preventative service at no   at no cost, but you should check your particular policy.      Breast Self-Awareness Breast self-awareness means being familiar with how your breasts look and feel. It involves checking your breasts regularly and reporting any changes to your health care  provider. Practicing breast self-awareness is important. A change in your breasts can be a sign of a serious medical problem. Being familiar with how your breasts look and feel allows you to find any problems early, when treatment is more likely to be successful. All women should practice breast self-awareness, including women who have had breast implants. How to do a breast self-exam One way to learn what is normal for your breasts and whether your breasts are changing is to do a breast self-exam. To do a breast self-exam: Look for Changes  Remove all the clothing above your waist. Stand in front of a mirror in a room with good lighting. Put your hands on your hips. Push your hands firmly downward. Compare your breasts in the mirror. Look for differences between them (asymmetry), such as: Differences in shape. Differences in size. Puckers, dips, and bumps in one breast and not the other. Look at each breast for changes in your skin, such as: Redness. Scaly areas. Look for changes in your nipples, such as: Discharge. Bleeding. Dimpling. Redness. A change in position. Feel for Changes Carefully feel your breasts for lumps and changes. It is best to do this while lying on your back on the floor and again while sitting or standing in the shower or tub with soapy water on your skin. Feel each breast in the following way: Place the arm on the side of the breast you are examining above your head. Feel your breast with the other hand. Start in the nipple area and make  inch (2 cm) overlapping circles to feel your breast. Use the pads of your three middle fingers to do this. Apply light pressure, then medium pressure, then firm pressure. The light pressure will allow you to feel the tissue closest to the skin. The medium pressure will allow you to feel the tissue that is a little deeper. The firm pressure will allow you to feel the tissue close to the ribs. Continue the overlapping circles,  moving downward over the breast until you feel your ribs below your breast. Move one finger-width toward the center of the body. Continue to use the  inch (2 cm) overlapping circles to feel your breast as you move slowly up toward your collarbone. Continue the up and down exam using all three pressures until you reach your armpit.  Write Down What You Find  Write down what is normal for each breast and any changes that you find. Keep a written record with breast changes or normal findings for each breast. By writing this information down, you do not need to depend only on memory for size, tenderness, or location. Write down where you are in your menstrual cycle, if you are still menstruating. If you are having trouble noticing differences in your breasts, do not get discouraged. With time you will become more familiar with the variations in your breasts and more comfortable with the exam. How often should I examine my breasts? Examine your breasts every month. If you are breastfeeding, the best time to examine your breasts is after a feeding or after using a breast pump. If you menstruate, the best time to examine your breasts is 5-7 days after your period is over. During your period, your breasts  are lumpier, and it may be more difficult to notice changes. When should I see my health care provider? See your health care provider if you notice: A change in shape or size of your breasts or nipples. A change in the skin of your breast or nipples, such as a reddened or scaly area. Unusual discharge from your nipples. A lump or thick area that was not there before. Pain in your breasts. Anything that concerns you.

## 2022-01-21 LAB — LIPID PANEL
Cholesterol: 237 mg/dL — ABNORMAL HIGH (ref <200)
HDL: 115 mg/dL (ref >=50)
LDL Cholesterol (Calc): 103 mg/dL (calc) — ABNORMAL HIGH
Non-HDL Cholesterol (Calc): 122 mg/dL (calc) (ref <130)
Total CHOL/HDL Ratio: 2.1 (calc) (ref <5.0)
Triglycerides: 98 mg/dL (ref <150)

## 2022-01-25 ENCOUNTER — Other Ambulatory Visit: Payer: Self-pay | Admitting: Obstetrics and Gynecology

## 2022-01-25 DIAGNOSIS — Z8619 Personal history of other infectious and parasitic diseases: Secondary | ICD-10-CM

## 2022-01-26 NOTE — Telephone Encounter (Signed)
This is not a daily medication, #90 not appropriate

## 2022-01-26 NOTE — Telephone Encounter (Signed)
Note on Rx "Pharmacy comment: REQUEST FOR 90 DAYS PRESCRIPTION. DX Code Needed."

## 2022-02-27 ENCOUNTER — Ambulatory Visit
Admission: RE | Admit: 2022-02-27 | Discharge: 2022-02-27 | Disposition: A | Discharge status: Home / Self Care | Payer: PRIVATE HEALTH INSURANCE | Source: Ambulatory Visit | Attending: Obstetrics and Gynecology | Admitting: Obstetrics and Gynecology

## 2022-02-27 DIAGNOSIS — Z1231 Encounter for screening mammogram for malignant neoplasm of breast: Secondary | ICD-10-CM

## 2022-03-03 ENCOUNTER — Other Ambulatory Visit: Payer: Self-pay | Admitting: Obstetrics and Gynecology

## 2022-03-03 DIAGNOSIS — R928 Other abnormal and inconclusive findings on diagnostic imaging of breast: Secondary | ICD-10-CM

## 2022-03-12 ENCOUNTER — Other Ambulatory Visit: Payer: PRIVATE HEALTH INSURANCE

## 2022-03-20 ENCOUNTER — Ambulatory Visit: Payer: PRIVATE HEALTH INSURANCE

## 2022-03-20 ENCOUNTER — Ambulatory Visit
Admission: RE | Admit: 2022-03-20 | Discharge: 2022-03-20 | Disposition: A | Discharge status: Home / Self Care | Payer: PRIVATE HEALTH INSURANCE | Source: Ambulatory Visit | Attending: Obstetrics and Gynecology | Admitting: Obstetrics and Gynecology

## 2022-03-20 DIAGNOSIS — R928 Other abnormal and inconclusive findings on diagnostic imaging of breast: Secondary | ICD-10-CM

## 2023-01-29 ENCOUNTER — Ambulatory Visit (INDEPENDENT_AMBULATORY_CARE_PROVIDER_SITE_OTHER): Payer: Commercial Managed Care - PPO | Admitting: Radiology

## 2023-01-29 ENCOUNTER — Encounter: Payer: Self-pay | Admitting: Radiology

## 2023-01-29 VITALS — BP 116/74 | Ht 63.0 in | Wt 140.0 lb

## 2023-01-29 DIAGNOSIS — E2839 Other primary ovarian failure: Secondary | ICD-10-CM

## 2023-01-29 DIAGNOSIS — Z01419 Encounter for gynecological examination (general) (routine) without abnormal findings: Secondary | ICD-10-CM | POA: Diagnosis not present

## 2023-01-29 DIAGNOSIS — N393 Stress incontinence (female) (male): Secondary | ICD-10-CM

## 2023-01-29 DIAGNOSIS — N958 Other specified menopausal and perimenopausal disorders: Secondary | ICD-10-CM | POA: Diagnosis not present

## 2023-01-29 MED ORDER — IMVEXXY MAINTENANCE PACK 10 MCG VA INST
1.0000 | VAGINAL_INSERT | VAGINAL | 11 refills | Status: DC
Start: 2023-02-01 — End: 2024-03-02

## 2023-01-29 NOTE — Progress Notes (Signed)
   Natalie [name] [facility] [birth year] [id number]   History: Postmenopausal 62 y.o. presents for annual exam. Worsening SUI, pelvic floor weakness, desires referral to PT. Dyspareunia and vaginal dryness, uber lube and coconut oil help some. Has occasional fissures from dryness at fourchette and around anus. Started a new job, stress levels have improved. No other concerns today.   Gynecologic History Postmenopausal Last Pap: 2020. Results were: normal Last mammogram: 03/2022. Results were: normal Last colonoscopy: 2014 DEXA:never HRT use: never  Obstetric History OB History  Gravida Para Term Preterm AB Living  1 0     1 0  SAB IAB Ectopic Multiple Live Births    1          # Outcome Date GA Lbr Len/2nd Weight Sex Type Anes PTL Lv  1 IAB              The following portions of the patient's history were reviewed and updated as appropriate: allergies, current medications, past family history, past medical history, past social history, past surgical history, and problem list.  Review of Systems Pertinent items noted in HPI and remainder of comprehensive ROS otherwise negative.  Past medical history, past surgical history, family history and social history were all reviewed and documented in the EPIC chart.  Exam:  Vitals:   01/29/23 1553  BP: 116/74  Weight: 140 lb (63.5 kg)  Height: 5\' 3"  (1.6 m)   Body mass index is 24.8 kg/m.  General appearance:  Normal Thyroid:  Symmetrical, normal in size, without palpable masses or nodularity. Respiratory  Auscultation:  Clear without wheezing or rhonchi Cardiovascular  Auscultation:  Regular rate, without rubs, murmurs or gallops  Edema/varicosities:  Not grossly evident Abdominal  Soft,nontender, without masses, guarding or rebound.  Liver/spleen:  No organomegaly noted  Hernia:  None appreciated  Skin  Inspection:  Grossly normal Breasts: Examined lying and sitting.   Right: Without masses, retractions, nipple discharge or  axillary adenopathy.   Left: Without masses, retractions, nipple discharge or axillary adenopathy. Genitourinary   Inguinal/mons:  Normal without inguinal adenopathy  External genitalia:  Normal appearing vulva with no masses, tenderness, or lesions  BUS/Urethra/Skene's glands:  Normal  Vagina:  Normal appearing with normal color and discharge, no lesions. Atrophy: moderate   Cervix:  Normal appearing without discharge or lesions  Uterus:  Normal in size, shape and contour.  Midline and mobile, nontender  Adnexa/parametria:     Rt: Normal in size, without masses or tenderness.   Lt: Normal in size, without masses or tenderness.  Anus and perineum: Normal    Raynelle Fanning, CMA present for exam  Assessment/Plan:   1. Well woman exam with routine gynecological exam Pap 2025 Colonoscopy due this year Schedule mammogram  2. Genitourinary syndrome of menopause - Estradiol (IMVEXXY MAINTENANCE PACK) 10 MCG INST; Place 1 tablet vaginally 2 (two) times a week.  Dispense: 8 each; Refill: 11  3. Estrogen deficiency Schedule with mammogram - DG Bone Density; Future  4. SUI (stress urinary incontinence, female) - Ambulatory referral to Physical Therapy    Discussed SBE, colonoscopy and DEXA screening as directed. Recommend of exercise weekly, including weight bearing exercise. Encouraged the use of seatbelts and sunscreen.  Return in 1 year for annual or sooner prn.  Arlie Solomons B WHNP-BC, 4:18 PM 01/29/2023

## 2023-02-01 ENCOUNTER — Other Ambulatory Visit: Payer: Self-pay | Admitting: Radiology

## 2023-02-01 DIAGNOSIS — Z1231 Encounter for screening mammogram for malignant neoplasm of breast: Secondary | ICD-10-CM

## 2023-02-01 DIAGNOSIS — E2839 Other primary ovarian failure: Secondary | ICD-10-CM

## 2023-02-11 ENCOUNTER — Ambulatory Visit: Payer: PRIVATE HEALTH INSURANCE | Admitting: Family Medicine

## 2023-02-26 ENCOUNTER — Other Ambulatory Visit (HOSPITAL_BASED_OUTPATIENT_CLINIC_OR_DEPARTMENT_OTHER): Payer: Self-pay

## 2023-02-26 MED ORDER — METHOCARBAMOL 500 MG PO TABS
500.0000 mg | ORAL_TABLET | Freq: Three times a day (TID) | ORAL | 0 refills | Status: DC | PRN
  Filled 2023-02-26: qty 30, 10d supply, fill #0

## 2023-03-05 ENCOUNTER — Ambulatory Visit
Admission: RE | Admit: 2023-03-05 | Discharge: 2023-03-05 | Disposition: A | Discharge status: Home / Self Care | Payer: Commercial Managed Care - PPO | Source: Ambulatory Visit | Attending: Radiology | Admitting: Radiology

## 2023-03-05 DIAGNOSIS — Z1231 Encounter for screening mammogram for malignant neoplasm of breast: Secondary | ICD-10-CM

## 2023-04-02 ENCOUNTER — Other Ambulatory Visit (HOSPITAL_BASED_OUTPATIENT_CLINIC_OR_DEPARTMENT_OTHER): Payer: Self-pay

## 2023-04-02 MED ORDER — INFLUENZA VIRUS VACC SPLIT PF (FLUZONE) 0.5 ML IM SUSY
0.5000 mL | PREFILLED_SYRINGE | Freq: Once | INTRAMUSCULAR | 0 refills | Status: AC
  Filled 2023-04-02: qty 0.5, 1d supply, fill #0

## 2023-04-02 MED ORDER — COVID-19 MRNA VAC-TRIS(PFIZER) 30 MCG/0.3ML IM SUSY
0.3000 mL | PREFILLED_SYRINGE | Freq: Once | INTRAMUSCULAR | 0 refills | Status: AC
  Filled 2023-04-02: qty 0.3, 1d supply, fill #0

## 2023-04-07 ENCOUNTER — Ambulatory Visit: Payer: Commercial Managed Care - PPO | Admitting: Physical Therapy

## 2023-05-26 ENCOUNTER — Encounter: Payer: Self-pay | Admitting: Internal Medicine

## 2023-07-21 ENCOUNTER — Encounter: Payer: Self-pay | Admitting: Family Medicine

## 2023-07-21 ENCOUNTER — Ambulatory Visit: Payer: Commercial Managed Care - PPO | Admitting: Family Medicine

## 2023-07-21 VITALS — BP 118/72 | HR 85 | Temp 97.9°F | Wt 147.0 lb

## 2023-07-21 DIAGNOSIS — K5900 Constipation, unspecified: Secondary | ICD-10-CM | POA: Diagnosis not present

## 2023-07-21 DIAGNOSIS — L853 Xerosis cutis: Secondary | ICD-10-CM | POA: Insufficient documentation

## 2023-07-21 DIAGNOSIS — M25552 Pain in left hip: Secondary | ICD-10-CM

## 2023-07-21 DIAGNOSIS — Z793 Long term (current) use of hormonal contraceptives: Secondary | ICD-10-CM

## 2023-07-21 DIAGNOSIS — Z13220 Encounter for screening for lipoid disorders: Secondary | ICD-10-CM | POA: Diagnosis not present

## 2023-07-21 DIAGNOSIS — M79671 Pain in right foot: Secondary | ICD-10-CM

## 2023-07-21 DIAGNOSIS — Z131 Encounter for screening for diabetes mellitus: Secondary | ICD-10-CM

## 2023-07-21 DIAGNOSIS — Z1211 Encounter for screening for malignant neoplasm of colon: Secondary | ICD-10-CM

## 2023-07-21 DIAGNOSIS — Z130 Encounter for screening for diseases of the blood and blood-forming organs and certain disorders involving the immune mechanism: Secondary | ICD-10-CM | POA: Diagnosis not present

## 2023-07-21 DIAGNOSIS — Z8249 Family history of ischemic heart disease and other diseases of the circulatory system: Secondary | ICD-10-CM | POA: Diagnosis not present

## 2023-07-21 DIAGNOSIS — R631 Polydipsia: Secondary | ICD-10-CM | POA: Diagnosis not present

## 2023-07-21 DIAGNOSIS — E663 Overweight: Secondary | ICD-10-CM | POA: Diagnosis not present

## 2023-07-21 DIAGNOSIS — Z0000 Encounter for general adult medical examination without abnormal findings: Secondary | ICD-10-CM

## 2023-07-21 LAB — COMPREHENSIVE METABOLIC PANEL
ALT: 20 U/L (ref 0–35)
AST: 22 U/L (ref 0–37)
Albumin: 5.2 g/dL (ref 3.5–5.2)
Alkaline Phosphatase: 93 U/L (ref 39–117)
BUN: 16 mg/dL (ref 6–23)
CO2: 29 meq/L (ref 19–32)
Calcium: 10 mg/dL (ref 8.4–10.5)
Chloride: 100 meq/L (ref 96–112)
Creatinine, Ser: 0.54 mg/dL (ref 0.40–1.20)
GFR: 99.16 mL/min (ref >60.00)
Glucose, Bld: 76 mg/dL (ref 70–99)
Potassium: 4 meq/L (ref 3.5–5.1)
Sodium: 139 meq/L (ref 135–145)
Total Bilirubin: 0.9 mg/dL (ref 0.2–1.2)
Total Protein: 7.5 g/dL (ref 6.0–8.3)

## 2023-07-21 LAB — LIPID PANEL
Cholesterol: 275 mg/dL — ABNORMAL HIGH (ref 0–200)
HDL: 118 mg/dL (ref >39.00)
LDL Cholesterol: 145 mg/dL — ABNORMAL HIGH (ref 0–99)
NonHDL: 157.49
Total CHOL/HDL Ratio: 2
Triglycerides: 61 mg/dL (ref 0.0–149.0)
VLDL: 12.2 mg/dL (ref 0.0–40.0)

## 2023-07-21 LAB — TSH: TSH: 2.17 u[IU]/mL (ref 0.35–5.50)

## 2023-07-21 LAB — HEMOGLOBIN A1C: Hgb A1c MFr Bld: 5.8 % (ref 4.6–6.5)

## 2023-07-21 LAB — CBC
HCT: 44.8 % (ref 36.0–46.0)
Hemoglobin: 14.9 g/dL (ref 12.0–15.0)
MCHC: 33.2 g/dL (ref 30.0–36.0)
MCV: 92 fL (ref 78.0–100.0)
Platelets: 364 10*3/uL (ref 150.0–400.0)
RBC: 4.87 Mil/uL (ref 3.87–5.11)
RDW: 13.6 % (ref 11.5–15.5)
WBC: 6.5 10*3/uL (ref 4.0–10.5)

## 2023-07-21 NOTE — Progress Notes (Signed)
 Patient ID: Natalie [name], female  DOB: 1960-09-29, 63 y.o.   MRN: [id number] Patient Care Team    Relationship Specialty Notifications Start End  Catherine Charlies LABOR, DO PCP - General Family Medicine  07/21/23   Ginette Shasta NOVAK, NP Nurse Practitioner Obstetrics and Gynecology  07/20/23   Abigail Maude POUR  Optometry  07/20/23     Chief Complaint  Patient presents with   Establish Care    No labs done at GYN CPE; going to [facility] for [facility]; only concern for today is left side hip pain; cancelled PT for urinary concerns     Subjective:  Natalie [name] is a 63 y.o.  female present for new patient establishment. All past medical history, surgical history, allergies, family history, immunizations, medications and social history were updated in the electronic medical record today. All recent labs, ED visits and hospitalizations within the last year were reviewed. Patient has been established with Dr. Domenica many years ago. Well woman-preventative exam with gynecology 02/01/2023-no labs  Health maintenance:  Colonoscopy: completed 09/24/2022, by retired Adult Nurse GI provider- referred to Dr. Shila Mammogram: completed: 03/05/2023-breast center, gyn orders.  Cervical cancer screening: last pap: 6/23/202020,-GYN Immunizations: tdap UTD 12/2019, Influenza UTD 03/2023 (encouraged yearly), Shingrix completed Infectious disease screening: HIV declined, Hep C completed DEXA: Scheduled 07/30/2023-vitamin D  deficiency   Patient reports she has bladder incontinence and was prescribed pelvic floor PT by her gynecology team.  However she has declined to attend because of the expense.  Patient reports she has noticed hair changes, skin changes, polydipsia over the last few months.  She is menopausal and was recently started on estradiol  suppositories 2 times a week by her gynecological team.  She has not had lab work in a few years.  History of elevated cholesterol and family history of heart disease in  her maternal grandmother, stroke in her paternal grandfather.  Patient was a former smoker, smoked from 1979-2010 by during this time it was intermittent smoking and she smoked less than 1 pack a day when she did smoke.  She does not meet criteria for lung cancer screening with less than 20-pack-year history.  Patient reports constipation symptoms and she will take Dulcolax if needed.  This usually occurs once a month.  Patient has multiple musculoskeletal complaints including right bunion, right large toe pain and left lateral hip pain along with groin pain.  She states she has always been an avid runner.  Since she started to develop foot pain she has been unable to keep up her running routine.  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  She has seen podiatry in the past for this and they offered her surgical treatment.  She declined surgical treatment, because she did not want a fusion in her large toe.  She wants to be able to continue running. She states now she is having left groin pain and left lateral hip pain.  She feels the origin of some of his pain is secondary to her gait change secondary to compensating for her right foot pain.  She also notices a little bit of that has been chronic since she was a child, which she feels is damage to the nerve in this location after she received an injection after a surgery.  She has a history of hip bursitis on the left.  And history of hip tendinopathy.      07/21/2023    1:54 PM  Depression screen PHQ 2/9  Decreased Interest 0  Down, Depressed, Hopeless 0  PHQ - 2 Score 0       No data to display                 07/21/2023    1:54 PM 01/29/2023    3:55 PM  Fall Risk   Falls in the past year? 0 1  Number falls in past yr: 0 0  Injury with Fall? 0 0  Risk for fall due to : No Fall Risks History of fall(s)  Follow up Falls evaluation completed      Immunization History  Administered Date(s)  Administered   Influenza Split 04/19/2012   Influenza Whole 05/14/2011   Influenza, Seasonal, Injecte, Preservative Fre 04/02/2023   Influenza-Unspecified 04/13/2019, 04/19/2020, 04/23/2021, 04/14/2022   Pfizer(Comirnaty )Fall Seasonal Vaccine 12 years and older 04/02/2023   Td 01/09/2020   Tdap 09/15/2010, 01/09/2020   Zoster Recombinant(Shingrix) 07/08/2020, 05/26/2021    No results found.  Past Medical History:  Diagnosis Date   Abnormal Pap smear of cervix 1996   Abnormal uterine bleeding    Allergy    seasonal   Anxiety    past   Bunion    Bursitis of hip 07/2009   left   Constipation 12/24/2010   Fracture of arm 2004   Hallux rigidus, right foot 10/05/2017   Hip pain, left 12/24/2010   HPV test positive    HSV-1 (herpes simplex virus 1) infection    Keratoconjunctivitis sicca not specified as Sjogren's, bilateral 03/28/2018   Lipoma of arm 02/14/2013   Mononucleosis    Myopia with presbyopia of both eyes 03/28/2018   Recurrent oral ulcers 07/22/2011   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   Vitamin D  deficiency 08/2010   Allergies  Allergen Reactions   Bactrim  [Sulfamethoxazole -Trimethoprim ] Rash   Past Surgical History:  Procedure Laterality Date   APPENDECTOMY  63 yrs old   CERVICAL BIOPSY  W/ LOOP ELECTRODE EXCISION  07/13/1994   LGSIL   COLONOSCOPY  09/23/2012   INTRAUTERINE DEVICE INSERTION  09/24/1997   Paraguard   Family History  Problem Relation Age of Onset   Depression Mother    COPD Mother    Arthritis Mother    Hypertension Mother    Breast cancer Mother 33       Metastasized to brain and bones   Prostate cancer Father 61   Arthritis Maternal Grandmother    Alcohol  abuse Maternal Grandfather    Heart disease Maternal Grandfather 28   Osteoporosis Paternal Grandmother    Stroke Paternal Grandfather        multiple mini strokes   Tuberculosis Paternal Grandfather    Colon cancer Neg Hx    Social History   Social History Narrative    Marital status/children/pets: Married   Education/employment: Engineer, maintenance (it), works as a Transport Planner:      -Wears a bicycle helmet riding a bike: Yes     -smoke alarm in the home:Yes     - wears seatbelt: Yes     - Feels safe in their relationships: Yes       Allergies as of 07/21/2023       Reactions   Bactrim  [sulfamethoxazole -trimethoprim ] Rash        Medication List        Accurate as of July 21, 2023 11:59 PM. If you have any questions, ask your nurse or doctor.  STOP taking these medications    aspirin -acetaminophen -caffeine 250-250-65 MG tablet Commonly known as: EXCEDRIN MIGRAINE Stopped by: Kaliyah Gladman   cetirizine 10 MG tablet Commonly known as: ZYRTEC Stopped by: Charlies Bellini   methocarbamol  500 MG tablet Commonly known as: ROBAXIN  Stopped by: Charlies Bellini   valACYclovir  1000 MG tablet Commonly known as: Valtrex  Stopped by: Charlies Bellini       TAKE these medications    Imvexxy  Maintenance Pack 10 MCG Inst Generic drug: Estradiol  Place 1 tablet vaginally 2 (two) times a week.        All past medical history, surgical history, allergies, family history, immunizations andmedications were updated in the EMR today and reviewed under the history and medication portions of their EMR.    Recent Results (from the past 2160 hours)  CBC     Status: None   Collection Time: 07/21/23  2:12 PM  Result Value Ref Range   WBC 6.5 4.0 - 10.5 K/uL   RBC 4.87 3.87 - 5.11 Mil/uL   Platelets 364.0 150.0 - 400.0 K/uL   Hemoglobin 14.9 12.0 - 15.0 g/dL   HCT 55.1 63.9 - 53.9 %   MCV 92.0 78.0 - 100.0 fl   MCHC 33.2 30.0 - 36.0 g/dL   RDW 86.3 88.4 - 84.4 %  Comp Met (CMET)     Status: None   Collection Time: 07/21/23  2:12 PM  Result Value Ref Range   Sodium 139 135 - 145 mEq/L   Potassium 4.0 3.5 - 5.1 mEq/L   Chloride 100 96 - 112 mEq/L   CO2 29 19 - 32 mEq/L   Glucose, Bld 76 70 - 99 mg/dL   BUN 16 6 - 23 mg/dL   Creatinine,  Ser 9.45 0.40 - 1.20 mg/dL   Total Bilirubin 0.9 0.2 - 1.2 mg/dL   Alkaline Phosphatase 93 39 - 117 U/L   AST 22 0 - 37 U/L   ALT 20 0 - 35 U/L   Total Protein 7.5 6.0 - 8.3 g/dL   Albumin 5.2 3.5 - 5.2 g/dL   GFR 00.83 >39.99 mL/min    Comment: Calculated using the CKD-EPI Creatinine Equation (2021)   Calcium 10.0 8.4 - 10.5 mg/dL  TSH     Status: None   Collection Time: 07/21/23  2:12 PM  Result Value Ref Range   TSH 2.17 0.35 - 5.50 uIU/mL  Lipid panel     Status: Abnormal   Collection Time: 07/21/23  2:12 PM  Result Value Ref Range   Cholesterol 275 (H) 0 - 200 mg/dL    Comment: ATP III Classification       Desirable:  < 200 mg/dL               Borderline High:  200 - 239 mg/dL          High:  > = 759 mg/dL   Triglycerides 38.9 0.0 - 149.0 mg/dL    Comment: Normal:  <849 mg/dLBorderline High:  150 - 199 mg/dL   HDL 881.99 >60.99 mg/dL   VLDL 87.7 0.0 - 59.9 mg/dL   LDL Cholesterol 854 (H) 0 - 99 mg/dL   Total CHOL/HDL Ratio 2     Comment:                Men          Women1/2 Average Risk     3.4          3.3Average Risk  5.0          4.42X Average Risk          9.6          7.13X Average Risk          15.0          11.0                       NonHDL 157.49     Comment: NOTE:  Non-HDL goal should be 30 mg/dL higher than patient's LDL goal (i.e. LDL goal of < 70 mg/dL, would have non-HDL goal of < 100 mg/dL)  Hemoglobin J8r     Status: None   Collection Time: 07/21/23  2:12 PM  Result Value Ref Range   Hgb A1c MFr Bld 5.8 4.6 - 6.5 %    Comment: Glycemic Control Guidelines for People with Diabetes:Non Diabetic:  <6%Goal of Therapy: <7%Additional Action Suggested:  >8%     MM 3D SCREENING MAMMOGRAM BILATERAL BREAST Result Date: 03/09/2023 CLINICAL DATA:  Screening. EXAM: DIGITAL SCREENING BILATERAL MAMMOGRAM WITH TOMOSYNTHESIS AND CAD TECHNIQUE: Bilateral screening digital craniocaudal and mediolateral oblique mammograms were obtained. Bilateral screening digital breast  tomosynthesis was performed. The images were evaluated with computer-aided detection. COMPARISON:  Previous exam(s). ACR Breast Density Category c: The breasts are heterogeneously dense, which may obscure small masses. FINDINGS: There are no findings suspicious for malignancy. IMPRESSION: No mammographic evidence of malignancy. A result letter of this screening mammogram will be mailed directly to the patient. RECOMMENDATION: Screening mammogram in one year. (Code:SM-B-01Y) BI-RADS CATEGORY  1: Negative. Electronically Signed   By: Rosaline Collet M.D.   On: 03/09/2023 11:19     ROS 14 pt review of systems performed and negative (unless mentioned in an HPI)  Objective: BP 118/72   Pulse 85   Temp 97.9 F (36.6 C)   Wt 147 lb (66.7 kg)   LMP 01/11/2016 (Approximate)   SpO2 96%   BMI 26.04 kg/m  Physical Exam Vitals and nursing note reviewed.  Constitutional:      General: She is not in acute distress.    Appearance: Normal appearance. She is not ill-appearing or toxic-appearing.  HENT:     Head: Normocephalic and atraumatic.     Right Ear: Tympanic membrane, ear canal and external ear normal. There is no impacted cerumen.     Left Ear: Tympanic membrane, ear canal and external ear normal. There is no impacted cerumen.     Nose: No congestion or rhinorrhea.     Mouth/Throat:     Mouth: Mucous membranes are moist.     Pharynx: Oropharynx is clear. No oropharyngeal exudate or posterior oropharyngeal erythema.  Eyes:     General: No scleral icterus.       Right eye: No discharge.        Left eye: No discharge.     Extraocular Movements: Extraocular movements intact.     Conjunctiva/sclera: Conjunctivae normal.     Pupils: Pupils are equal, round, and reactive to light.  Cardiovascular:     Rate and Rhythm: Normal rate and regular rhythm.     Pulses: Normal pulses.     Heart sounds: Normal heart sounds. No murmur heard.    No friction rub. No gallop.  Pulmonary:     Effort:  Pulmonary effort is normal. No respiratory distress.     Breath sounds: Normal breath sounds. No stridor. No wheezing, rhonchi or rales.  Chest:  Chest wall: No tenderness.  Abdominal:     General: Abdomen is flat. Bowel sounds are normal. There is no distension.     Palpations: Abdomen is soft. There is no mass.     Tenderness: There is no abdominal tenderness. There is no right CVA tenderness, left CVA tenderness, guarding or rebound.     Hernia: No hernia is present.  Musculoskeletal:        General: No swelling, tenderness or deformity. Normal range of motion.     Cervical back: Normal range of motion and neck supple. No rigidity or tenderness.     Right lower leg: No edema.     Left lower leg: No edema.  Lymphadenopathy:     Cervical: No cervical adenopathy.  Skin:    General: Skin is warm and dry.     Coloration: Skin is not jaundiced or pale.     Findings: No bruising, erythema, lesion or rash.  Neurological:     General: No focal deficit present.     Mental Status: She is alert and oriented to person, place, and time. Mental status is at baseline.     Cranial Nerves: No cranial nerve deficit.     Sensory: No sensory deficit.     Motor: No weakness.     Coordination: Coordination normal.     Gait: Gait normal.     Deep Tendon Reflexes: Reflexes normal.  Psychiatric:        Mood and Affect: Mood normal.        Behavior: Behavior normal.        Thought Content: Thought content normal.        Judgment: Judgment normal.        Assessment/plan: Natalie [name] is a 63 y.o. female present for establish care with multiple acute and chronic complaints and need for routine labs Encounter for medical examination to establish care Colon cancer screening Prior patient of [facility], her provider retired.  She would like referral to Dr. Shila today. - Ambulatory referral to Gastroenterology   Constipation, unspecified constipation type (Primary) Okay to use  Dulcolax infrequently.  Could also consider MiraLAX use. - CBC - Comp Met (CMET) - TSH  Lipid screening/(BMI 25.0-29.9)- E66.3/diabetes screening/ Family history of heart disease --A1c - CBC - Lipid panel - CT cardiac score-self-pay ordered today  Screening for deficiency anemia - CBC  Long term current use of hormonal contraceptive - CBC - Comp Met (CMET) - Lipid panel - Hemoglobin A1c  Polydipsia/dry skin - Hemoglobin A1c -TSH  Bunion/osteoarthritis foot/left hip pain She is an avid runner and has not been able to keep up with her running secondary to above complaints.  We discussed options today and she would like referral to sports med for evaluation, recommendations and consider OMT therapy We briefly discussed NSAID therapy today. Referral to  sports medicine placed  Return in about 1 year (around 07/21/2024) for cpe (20 min).  Orders Placed This Encounter  Procedures   CT CARDIAC SCORING (SELF PAY ONLY)   CBC   Comp Met (CMET)   TSH   Lipid panel   Hemoglobin A1c   Ambulatory referral to Gastroenterology   Ambulatory referral to Sports Medicine   No orders of the defined types were placed in this encounter.  Referral Orders         Ambulatory referral to Gastroenterology         Ambulatory referral to Sports Medicine       Note is  dictated utilizing voice recognition software. Although note has been proof read prior to signing, occasional typographical errors still can be missed. If any questions arise, please do not hesitate to call for verification.  Electronically signed by: Charlies Bellini, DO [facility] [place]

## 2023-07-21 NOTE — Patient Instructions (Addendum)

## 2023-07-30 ENCOUNTER — Ambulatory Visit
Admission: RE | Admit: 2023-07-30 | Discharge: 2023-07-30 | Disposition: A | Discharge status: Home / Self Care | Payer: Commercial Managed Care - PPO | Source: Ambulatory Visit | Attending: Radiology | Admitting: Radiology

## 2023-07-30 DIAGNOSIS — E2839 Other primary ovarian failure: Secondary | ICD-10-CM | POA: Diagnosis not present

## 2023-07-30 DIAGNOSIS — N958 Other specified menopausal and perimenopausal disorders: Secondary | ICD-10-CM | POA: Diagnosis not present

## 2023-07-30 DIAGNOSIS — M8588 Other specified disorders of bone density and structure, other site: Secondary | ICD-10-CM | POA: Diagnosis not present

## 2023-09-08 ENCOUNTER — Ambulatory Visit (HOSPITAL_BASED_OUTPATIENT_CLINIC_OR_DEPARTMENT_OTHER): Admission: RE | Admit: 2023-09-08 | Payer: Commercial Managed Care - PPO | Source: Ambulatory Visit

## 2023-12-02 ENCOUNTER — Ambulatory Visit: Admitting: Family Medicine

## 2024-01-24 ENCOUNTER — Other Ambulatory Visit: Payer: Self-pay | Admitting: Radiology

## 2024-01-24 DIAGNOSIS — Z1231 Encounter for screening mammogram for malignant neoplasm of breast: Secondary | ICD-10-CM

## 2024-01-31 ENCOUNTER — Other Ambulatory Visit: Payer: Self-pay

## 2024-01-31 ENCOUNTER — Ambulatory Visit: Admitting: Family Medicine

## 2024-01-31 ENCOUNTER — Ambulatory Visit (INDEPENDENT_AMBULATORY_CARE_PROVIDER_SITE_OTHER)

## 2024-01-31 VITALS — BP 132/88 | HR 78 | Ht 63.0 in | Wt 152.0 lb

## 2024-01-31 DIAGNOSIS — M160 Bilateral primary osteoarthritis of hip: Secondary | ICD-10-CM | POA: Diagnosis not present

## 2024-01-31 DIAGNOSIS — M810 Age-related osteoporosis without current pathological fracture: Secondary | ICD-10-CM

## 2024-01-31 DIAGNOSIS — G8929 Other chronic pain: Secondary | ICD-10-CM | POA: Diagnosis not present

## 2024-01-31 DIAGNOSIS — M8588 Other specified disorders of bone density and structure, other site: Secondary | ICD-10-CM

## 2024-01-31 DIAGNOSIS — M25552 Pain in left hip: Secondary | ICD-10-CM

## 2024-01-31 DIAGNOSIS — M6289 Other specified disorders of muscle: Secondary | ICD-10-CM | POA: Diagnosis not present

## 2024-01-31 DIAGNOSIS — M8580 Other specified disorders of bone density and structure, unspecified site: Secondary | ICD-10-CM | POA: Insufficient documentation

## 2024-01-31 LAB — VITAMIN D 25 HYDROXY (VIT D DEFICIENCY, FRACTURES): VITD: 23.73 ng/mL — ABNORMAL LOW (ref 30.00–100.00)

## 2024-01-31 NOTE — Patient Instructions (Addendum)
 Thank you for coming in today.   Turf toe insert  Please get labs today before you leave   Consider working with a personal trainer  I've referred you to Physical Therapy.  Let us  know if you don't hear from them in one week.   I've referred you to Orthopedic Surgery to see Dr. Vernetta.  Let us  know if you don't hear from them in one week.

## 2024-01-31 NOTE — Progress Notes (Signed)
 LILLETTE Ileana Collet, PhD, LAT, ATC acting as a scribe for Artist Lloyd, MD.  Natalie [name] is a 63 y.o. female who presents to [facility] today for L hip pain consistently for 3 years, randomly 5-6 years. Pt locates pain to the lateral aspect of the hip and into the L groin. She notes hx of being a runner. Pain disturbs her sleep at night.   Radiates: yes- along lateral aspect of the L thigh to knee Aggravates: being still, getting in/out car, walking, prolonged sitting, hip aBd Treatments tried: staying mobile, IBU, meloxicam, Bengay, stretching  Dx testing: 07/30/23 DEXA  Pertinent review of systems: No fevers or chills.  Additionally patient notes some stress incontinence and pelvic floor dysfunction.  She does have a scheduled appointment with her OB/GYN in the near future.  She has never tried pelvic physical therapy.  Relevant historical information: Osteopenia   Exam:  BP 132/88   Pulse 78   Ht 5' 3 (1.6 m)   Wt 152 lb (68.9 kg)   LMP 01/11/2016 (Approximate)   SpO2 92%   BMI 26.93 kg/m  General: Well Developed, well nourished, and in no acute distress.   MSK: Left hip decreased range of motion.  Decreased strength abduction and external rotation.    Lab and Radiology Results  X-ray images left hip obtained today personally and independently interpreted. Moderate to severe left hip osteoarthritis.  No acute fractures are visible. Await formal radiology review.   Assessment and Plan: 63 y.o. female with chronic left hip pain and lack of range of motion.  Pain is multifactorial.  Anterior hip pain due to DJD.  Lateral hip pain due to hip abductor tendinopathy and dysfunction.  She has a scheduled appointment in the near future with personal training to improve hip strength.  I think this is a great idea.  However if she has a fair amount of hip arthritis which is certainly contribution to her hip pain.  Plan to refer to orthopedic  surgery to discuss total hip replacement.  I do not think a steroid injection is gena make much of a difference.  Possibly she may benefit from a diagnostic injection which I can leave to orthopedic surgery discretion.  Additionally she has some pelvic floor dysfunction.  Refer to pelvic PT and keep follow-up appointment with OB/GYN.  Osteopenia: It has been quite a while since vitamin D  was checked and she has had vitamin D  deficiency in the past.  Will go ahead and check vitamin D  now.  Resistive training will be helpful for prevention of osteoporosis and fractures.  PDMP not reviewed this encounter. Orders Placed This Encounter  Procedures   DG HIP UNILAT W OR W/O PELVIS 2-3 VIEWS LEFT    Standing Status:   Future    Number of Occurrences:   1    Expiration Date:   03/02/2024    Reason for Exam (SYMPTOM  OR DIAGNOSIS REQUIRED):   left hip pain    Preferred imaging location?:   [place]   US  LIMITED JOINT SPACE STRUCTURES LOW LEFT(NO LINKED CHARGES)    Reason for Exam (SYMPTOM  OR DIAGNOSIS REQUIRED):   left hip pain    Preferred imaging location?:   [facility]   VITAMIN D  25 Hydroxy (Vit-D Deficiency, Fractures)    Standing Status:   Future    Number of Occurrences:   1    Expiration Date:   01/30/2025  Ambulatory referral to Physical Therapy    Referral Priority:   Routine    Referral Type:   Physical Medicine    Referral Reason:   Specialty Services Required    Requested Specialty:   Physical Therapy    Number of Visits Requested:   1   Ambulatory referral to Orthopedic Surgery    Referral Priority:   Routine    Referral Type:   Surgical    Referral Reason:   Specialty Services Required    Referred to Provider:   Vernetta Lonni GRADE, MD    Requested Specialty:   Orthopedic Surgery    Number of Visits Requested:   1   No orders of the defined types were placed in this encounter.    Discussed warning signs or symptoms. Please see  discharge instructions. Patient expresses understanding.   The above documentation has been reviewed and is accurate and complete Artist Lloyd, M.D.

## 2024-02-01 ENCOUNTER — Ambulatory Visit: Payer: Self-pay | Admitting: Family Medicine

## 2024-02-01 NOTE — Progress Notes (Signed)
 Vitamin D  level is a bit low.  I recommend taking 5000 units of over-the-counter vitamin D3 daily.

## 2024-02-07 NOTE — Progress Notes (Signed)
Left hip x-ray shows mild arthritis

## 2024-02-09 ENCOUNTER — Ambulatory Visit (HOSPITAL_BASED_OUTPATIENT_CLINIC_OR_DEPARTMENT_OTHER)
Admission: RE | Admit: 2024-02-09 | Discharge: 2024-02-09 | Disposition: A | Discharge status: Home / Self Care | Payer: Self-pay | Source: Ambulatory Visit | Attending: Family Medicine | Admitting: Family Medicine

## 2024-02-09 DIAGNOSIS — Z8249 Family history of ischemic heart disease and other diseases of the circulatory system: Secondary | ICD-10-CM | POA: Insufficient documentation

## 2024-02-10 ENCOUNTER — Ambulatory Visit: Payer: Self-pay | Admitting: Family Medicine

## 2024-02-21 ENCOUNTER — Ambulatory Visit: Attending: Family Medicine | Admitting: Physical Therapy

## 2024-02-21 ENCOUNTER — Encounter: Payer: Self-pay | Admitting: Physical Therapy

## 2024-02-21 ENCOUNTER — Other Ambulatory Visit: Payer: Self-pay

## 2024-02-21 DIAGNOSIS — M6289 Other specified disorders of muscle: Secondary | ICD-10-CM | POA: Diagnosis not present

## 2024-02-21 DIAGNOSIS — R279 Unspecified lack of coordination: Secondary | ICD-10-CM | POA: Insufficient documentation

## 2024-02-21 DIAGNOSIS — N393 Stress incontinence (female) (male): Secondary | ICD-10-CM | POA: Diagnosis not present

## 2024-02-21 DIAGNOSIS — M62838 Other muscle spasm: Secondary | ICD-10-CM | POA: Diagnosis not present

## 2024-02-21 DIAGNOSIS — M25552 Pain in left hip: Secondary | ICD-10-CM | POA: Diagnosis not present

## 2024-02-21 DIAGNOSIS — M6281 Muscle weakness (generalized): Secondary | ICD-10-CM | POA: Insufficient documentation

## 2024-02-21 DIAGNOSIS — R293 Abnormal posture: Secondary | ICD-10-CM | POA: Diagnosis not present

## 2024-02-21 NOTE — Therapy (Signed)
 OUTPATIENT PHYSICAL THERAPY FEMALE PELVIC EVALUATION   Patient Name: Natalie [name] MRN: [id number] DOB:07/14/1960, 63 y.o., female Today's Date: 02/21/2024  END OF SESSION:  PT End of Session - 02/21/24 1605     Visit Number 1    Date for PT Re-Evaluation 08/23/24    Authorization Type  EMPLOYEE    PT Start Time 1531    PT Stop Time 1610    PT Time Calculation (min) 39 min    Activity Tolerance Patient tolerated treatment well    Behavior During Therapy [facility] for tasks assessed/performed          Past Medical History:  Diagnosis Date   Abnormal Pap smear of cervix 1996   Abnormal uterine bleeding    Allergy    seasonal   Anxiety    past   Bunion    Bursitis of hip 07/2009   left   Constipation 12/24/2010   Fracture of arm 2004   Hallux rigidus, right foot 10/05/2017   Hip pain, left 12/24/2010   HPV test positive    HSV-1 (herpes simplex virus 1) infection    Keratoconjunctivitis sicca not specified as Sjogren's, bilateral 03/28/2018   Lipoma of arm 02/14/2013   Mononucleosis    Myopia with presbyopia of both eyes 03/28/2018   Recurrent oral ulcers 07/22/2011   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   Vitamin D  deficiency 08/2010   Past Surgical History:  Procedure Laterality Date   APPENDECTOMY  63 yrs old   CERVICAL BIOPSY  W/ LOOP ELECTRODE EXCISION  07/13/1994   LGSIL   COLONOSCOPY  09/23/2012   INTRAUTERINE DEVICE INSERTION  09/24/1997   Paraguard   Patient Active Problem List   Diagnosis Date Noted   Osteopenia 01/31/2024   (BMI 25.0-29.9)- E66.3 07/21/2023   Constipation 07/21/2023   Family history of heart disease 07/21/2023   Long term current use of hormonal contraceptive 07/21/2023   Polydipsia 07/21/2023   Dry skin 07/21/2023    PCP: Catherine Charlies LABOR, DO   REFERRING PROVIDER: Joane Artist RAMAN, MD  REFERRING DIAG: [phone] (ICD-10-CM) - Pelvic floor dysfunction  THERAPY DIAG:  Muscle weakness (generalized)  Left hip  pain  Stress incontinence  Abnormal posture  Other muscle spasm  Unspecified lack of coordination  Rationale for Evaluation and Treatment: Rehabilitation  ONSET DATE: 5 years ago  SUBJECTIVE:                                                                                                                                                                                           SUBJECTIVE STATEMENT: Had had urinary incontinence for about 5 years,  in the last several years has decreased activity levels and has had worse leakage. Also hip pain at left hip and anterior pelvis. Has been getting worse and worse to now has difficulty walking up incline from parking garage to enter work and getting in/out of car.  Is taking estrogen suppositories and this helps a little but not resolved.  Urinary incontinence is with sneezing, laughing, coughing, and sometimes just has a slow leak. Feels damp.  [facility] and is able to do elliptical but can't run and uses a trainer 2 days weekly.    PAIN:  Are you having pain? Yes NPRS scale: 0-5-7/10 Pain location: Lt hip and anterior pelvis  Pain type: aching and stingy Pain description: intermittent   Aggravating factors: walking on incline, car transfers, low seat, stairs Relieving factors: rest or meds if needed  PRECAUTIONS: Other: Osteopenia  RED FLAGS: None   WEIGHT BEARING RESTRICTIONS: No  FALLS:  Has patient fallen in last 6 months? No  OCCUPATION: lab director   ACTIVITY LEVEL : moderate- high (works out 5 days weekly)  PLOF: Independent  PATIENT GOALS: to have less hip pain and less urinary incontinence   PERTINENT HISTORY:  HPV test positive, HSV-1 (herpes simplex virus 1) infection, Anxiety, CERVICAL BIOPSY  W/ LOOP ELECTRODE EXCISION LGSIL, Osteopenia, Constipation,  Sexual abuse: No  BOWEL MOVEMENT: Pain with bowel movement: No Type of bowel movement:Type (Bristol  Stool Scale) 1, 3-5, Frequency 1 full bowel movement weekly, type 1s daily, and Strain no Fully empty rectum: Yes: 1-2x weekly but not all bowel movements Leakage: No but has had type one small stool expelled during intercourse before Pads: No Fiber supplement/laxative No  URINATION: Pain with urination: No Fully empty bladder: Yes:   Stream: Strong Urgency: Yes  Frequency: does have to empty prior and after workouts or will have leakage, not quicker than every 2 hours, sometimes 0-2x Leakage: Urge to void, Coughing, Sneezing, Laughing, and Exercise Pads: No  INTERCOURSE:  Ability to have vaginal penetration Yes  Pain with intercourse: at hip DrynessYes  Climax: not painful Marinoff Scale: 1/3  PREGNANCY: Vaginal deliveries 0 C-section deliveries 0 Currently pregnant No  PROLAPSE: None   OBJECTIVE:  Note: Objective measures were completed at Evaluation unless otherwise noted.  DIAGNOSTIC FINDINGS:  DG HIP UNILAT 01/31/24 FINDINGS: Pelvic ring is intact. Degenerative changes of the hip joints are noted bilaterally. No acute fracture or dislocation is noted. No soft tissue changes are seen.   IMPRESSION: Mild degenerative change without acute abnormality.   Urogenital Distress Inventory (UDI-6 Short Form) Score = 63  COGNITION: Overall cognitive status: Within functional limits for tasks assessed     SENSATION: Light touch: Appears intact  LUMBAR SPECIAL TESTS:  SI Compression/distraction test: slightly decreased pain with compression, FABER test: Positive, and Gaenslen's test: Negative (+) on Lt FUNCTIONAL TESTS:  Functional squat with decreased mobility on Lt hip, weight shift to Rt, and decreased descent by 25%  GAIT: WFL  POSTURE: rounded shoulders, forward head, and posterior pelvic tilt   LUMBARAROM/PROM:  A/PROM A/PROM  eval  Flexion WFL  Extension WFL  Right lateral flexion Limited by 25%  Left lateral flexion Limited by 25%  Right  rotation WFL  Left rotation WFL   (Blank rows = not tested)  LOWER EXTREMITY ROM:  Lt hip painful with end range motion and limited by 75% with hip ER, right hip WFL with 25%  limitation in hip ER  LOWER EXTREMITY MMT:  Lt hip grossly 4/5 but with pain in jt throughout, rt hip grossly 4/5 PALPATION:   General: tightness noted in bil lumbar paraspinals, Lt piriformis, Lt SIJ, Lt gluteal, Lt proximal adductor   Pelvic Alignment: WFL  Abdominal: tightness in all quadrants but without pain                 External Perineal Exam: mild dryness                              Internal Pelvic Floor: replicated pain with palpation of Lt obturator internus and made worse with rotation of Lt hip, Rt hip also has TTP at obturator but greatly less  Patient confirms identification and approves PT to assess internal pelvic floor and treatment Yes No emotional/communication barriers or cognitive limitation. Patient is motivated to learn. Patient understands and agrees with treatment goals and plan. PT explains patient will be examined in standing, sitting, and lying down to see how their muscles and joints work. When they are ready, they will be asked to remove their underwear so PT can examine their perineum. The patient is also given the option of providing their own chaperone as one is not provided in our facility. The patient also has the right and is explained the right to defer or refuse any part of the evaluation or treatment including the internal exam. With the patient's consent, PT will use one gloved finger to gently assess the muscles of the pelvic floor, seeing how well it contracts and relaxes and if there is muscle symmetry. After, the patient will get dressed and PT and patient will discuss exam findings and plan of care. PT and patient discuss plan of care, schedule, attendance policy and HEP activities.  PELVIC MMT:   MMT eval  Vaginal (No cues - 0/5) with cues - 3/5 4s, 5 reps  Internal  Anal Sphincter   External Anal Sphincter   Puborectalis   Diastasis Recti   (Blank rows = not tested)        TONE: Increased in deep layer bil  PROLAPSE: Not seen in hooklying with coughing   TODAY'S TREATMENT:                                                                                                                              DATE:   EVAL Examination completed, findings reviewed, pt educated on POC, HEP, and female pelvic floor anatomy, breathing and pelvic floor coordination with proper techniques for activation without compensatory strategies with good effect.. Pt motivated to participate in PT and agreeable to attempt recommendations.     PATIENT EDUCATION:  Education details: [id number] Person educated: Patient Education method: Explanation, Demonstration, Tactile cues, Verbal cues, and Handouts Education comprehension: verbalized understanding, returned demonstration, verbal cues required, tactile cues required, and needs further education  HOME EXERCISE PROGRAM: [id number]  ASSESSMENT:  CLINICAL IMPRESSION: Patient is a 63 y.o. female  who was seen today for physical therapy evaluation and treatment for pelvic pain, Lt hip pain, urinary incontinence and intermittent constipation. Pt reports she has had chronic hip pain and is being followed for this by MD and has significantly increased working out to 5 days weekly and has seen improvement in hip pain already, urinary incontinence still a concern and worsening over time. Constipation is also bothersome intermittently. Pt reports she does need to change positions with intercourse due to pelvic deep pain and Lt hip pain, feels pain worse with walking and especially with incline walking and with rotation of hips. Pt found to have decreased core and hip strength with MMTs and impaired posture and squat mechanics, decreased flexibility in spine and hips worse at Lt. Patient consented to internal pelvic floor assessment vaginally  this date and found to have decreased strength, endurance, and coordination, needed max cues to contract pelvic floor and extra time to coordinate with breathing. .Pt would benefit from additional PT to further address deficits.     OBJECTIVE IMPAIRMENTS: decreased activity tolerance, decreased coordination, decreased endurance, decreased mobility, difficulty walking, decreased ROM, decreased strength, increased fascial restrictions, impaired perceived functional ability, increased muscle spasms, impaired flexibility, improper body mechanics, postural dysfunction, and pain.   ACTIVITY LIMITATIONS: carrying, lifting, bending, standing, squatting, stairs, transfers, continence, and locomotion level  PARTICIPATION LIMITATIONS: cleaning, interpersonal relationship, community activity, occupation, and yard work  PERSONAL FACTORS: Fitness and Time since onset of injury/illness/exacerbation are also affecting patient's functional outcome.   REHAB POTENTIAL: Good  CLINICAL DECISION MAKING: Stable/uncomplicated  EVALUATION COMPLEXITY: Low   GOALS: Goals reviewed with patient? Yes  SHORT TERM GOALS: Target date: 03/20/24  Pt to be I with HEP for carry over and continuing recommendations for improved outcomes.   Baseline: Goal status: INITIAL  2.  Pt will be independent with the knack, urge suppression technique, and double voiding in order to improve bladder habits and decrease urinary incontinence.   Baseline:  Goal status: INITIAL  3.  Pt will be independent with use of squatty potty, relaxed toileting mechanics, and improved bowel movement techniques in order to increase ease of bowel movements and complete evacuation.   Baseline:  Goal status: INITIAL  4.  Pt to be I with coordination of pelvic floor and breathing mechanics for x10 body weight squats for improved lift mechanics without strain at pelvic floor.  Baseline:  Goal status: INITIAL   LONG TERM GOALS: Target date:  04/03/24  Pt to be I with advanced HEP for carry over and continuing recommendations for improved outcomes.   Baseline:  Goal status: INITIAL  2.  Pt to demonstrate at least 5/5 bil hip strength for improved pelvic stability and functional squats without leakage or worse pain.  Baseline:  Goal status: INITIAL  3.  Pt to demonstrate improved coordination of pelvic floor and breathing mechanics with 10# squat with appropriate synergistic patterns to decrease pain and leakage at least 75% of the time for improved ability to complete a 30 minute workout with strain at pelvic floor and symptoms.    Baseline:  Goal status: INITIAL  4.  Pt to tolerate at least 30 mins of walking without worsening pain due to improved hip and pelvic floor strengthening to improved tolerance to work out goals for patient.  Baseline:  Goal status: INITIAL  5.  Pt will report her bowel movements are complete due to improved bowel habits and evacuation techniques  at least 3 bowel movement weekly. .  Baseline:  Goal status: INITIAL  6.  Pt to demonstrate upright midline posture for at least 30 mins without cues for decreased strain at pelvic floor and leakage.  Baseline:  Goal status: INITIAL  PLAN:  PT FREQUENCY: 1x/week  PT DURATION: 8 sessions  PLANNED INTERVENTIONS: 97110-Therapeutic exercises, 97530- Therapeutic activity, 97112- Neuromuscular re-education, 97535- Self Care, 02859- Manual therapy, [id number]- Canalith repositioning, [id number]- Aquatic Therapy, [phone]- Electrical stimulation (manual), [id number]- Vasopneumatic device, [id number]- Ultrasound, [id number]- Traction (mechanical), [id number]- Ionotophoresis 4mg /ml Dexamethasone, [id number] (1-2 muscles), 20561 (3+ muscles)- Dry Needling, Patient/Family education, Taping, Joint mobilization, Spinal mobilization, Scar mobilization, DME instructions, Cryotherapy, Moist heat, and Biofeedback  PLAN FOR NEXT SESSION: hip and core strengthening, obturator strengthening and manual bil,  coordination of breathing pelvic floor with strengthening   Darryle Navy, PT, DPT 08/11/258:17 PM  [facility] [address] [place], [place] [address] Phone # [phone] Fax [phone]

## 2024-02-23 ENCOUNTER — Other Ambulatory Visit (HOSPITAL_BASED_OUTPATIENT_CLINIC_OR_DEPARTMENT_OTHER): Payer: Self-pay

## 2024-02-23 ENCOUNTER — Other Ambulatory Visit (INDEPENDENT_AMBULATORY_CARE_PROVIDER_SITE_OTHER): Payer: Self-pay

## 2024-02-23 ENCOUNTER — Ambulatory Visit: Admitting: Orthopaedic Surgery

## 2024-02-23 VITALS — Ht 65.0 in | Wt 153.2 lb

## 2024-02-23 DIAGNOSIS — M25552 Pain in left hip: Secondary | ICD-10-CM

## 2024-02-23 MED ORDER — TRAMADOL HCL 50 MG PO TABS
50.0000 mg | ORAL_TABLET | Freq: Two times a day (BID) | ORAL | 0 refills | Status: DC | PRN
  Filled 2024-02-23 (×2): qty 30, 8d supply, fill #0

## 2024-02-23 MED ORDER — CELECOXIB 200 MG PO CAPS
200.0000 mg | ORAL_CAPSULE | Freq: Two times a day (BID) | ORAL | 1 refills | Status: DC | PRN
  Filled 2024-02-23 (×2): qty 60, 30d supply, fill #0
  Filled 2024-03-28: qty 60, 30d supply, fill #1

## 2024-02-23 NOTE — Progress Notes (Signed)
 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  She used to be a very active runner and now cannot run as much due to left hip pain.  It started as lateral hip pain but it is extending into her groin.  This been going on for at least 7 years now and slowly getting worse.  She does get on the elliptical much more easily than other activities but she is having increased pain in the groin with activities and trouble sleeping at night due to left hip pain.  She has pain when lifting that leg as well as crossing her legs and even performing actives daily living such as putting on shoes and socks.  Walking even increases pain.  She has only been taking ibuprofen for this pain.  At this point her left hip pain is definitely affecting her mobility, her quality of life and her actives daily living.  She has never had surgery on that hip or any type of injuries.  She does not have any chronic medical issues at all and is not diabetic and not obese.  I was able to review her past medical history and medications within epic.  On examination of the right hip moves smoothly and fluidly and has full rotation and no blocks to rotation and no pain.  Her left hip is stiff with internal and external rotation as well as shows significant pain in the groin with rotation of the left hip.  An AP pelvis and lateral left hip shows near bone-on-bone wear of the left hip joint space with significant joint space narrowing as well as periarticular osteophytes and some slight flattening of the femoral head.  Her right hip joint space is well-maintained and normal.  I went over her x-rays with her in detail and talked about hip replacement surgery.  She is ready at this point having dealt with the pain for Natalie [name] enough.  We talked about hip replacement surgery and describe what the surgery involves including a discussion of the risks and benefits of surgery and what to  expect from an intraoperative and postoperative course.  She is interested in getting on the OR schedule to have this done.  She asked absolutely appropriate questions about MAKOplasty with robotic surgery.  She understands that I do not perform robotic surgery but I am meticulous about implant positioning using fluoroscopic guidance and measurements.  I did give her the name of one of my colleagues in [place] and that may use MAKOplasty for total hip so I am not sure.  She said after our discussion she would like to go ahead and schedule surgery with us  for Natalie Natalie [name] sometime in the near future.  Will work on getting this scheduled.  All questions and concerns were answered and addressed.  She is welcome to reach out anytime as well for other questions.

## 2024-03-02 ENCOUNTER — Encounter: Payer: Self-pay | Admitting: Radiology

## 2024-03-02 ENCOUNTER — Ambulatory Visit (INDEPENDENT_AMBULATORY_CARE_PROVIDER_SITE_OTHER): Admitting: Radiology

## 2024-03-02 ENCOUNTER — Other Ambulatory Visit (HOSPITAL_COMMUNITY)
Admission: RE | Admit: 2024-03-02 | Discharge: 2024-03-02 | Disposition: A | Discharge status: Home / Self Care | Source: Ambulatory Visit | Attending: Radiology | Admitting: Radiology

## 2024-03-02 ENCOUNTER — Other Ambulatory Visit (HOSPITAL_BASED_OUTPATIENT_CLINIC_OR_DEPARTMENT_OTHER): Payer: Self-pay

## 2024-03-02 VITALS — BP 122/84 | HR 81 | Ht 63.75 in | Wt 149.0 lb

## 2024-03-02 DIAGNOSIS — Z01419 Encounter for gynecological examination (general) (routine) without abnormal findings: Secondary | ICD-10-CM

## 2024-03-02 DIAGNOSIS — N958 Other specified menopausal and perimenopausal disorders: Secondary | ICD-10-CM

## 2024-03-02 DIAGNOSIS — Z1331 Encounter for screening for depression: Secondary | ICD-10-CM | POA: Diagnosis not present

## 2024-03-02 MED ORDER — ESTRADIOL 0.1 MG/GM VA CREA
1.0000 g | TOPICAL_CREAM | VAGINAL | 3 refills | Status: AC
  Filled 2024-03-02: qty 42.5, 90d supply, fill #0

## 2024-03-02 NOTE — Progress Notes (Signed)
   Natalie [name] [facility] September 29, 1960 [id number]   History: Postmenopausal 63 y.o. presents for annual exam. Planning for left hip replacement before the end of the year. C/o hot flashes when using vaginal estrogen tablets. Also, c/o thinning of the tissue of her labia and perineum with occasional tearing. Exercises more frequently, feels stronger.    Gynecologic History Postmenopausal Last Pap: 2020. Results were: normal Last mammogram: 03/05/23. Results were: normal Last colonoscopy: 2014 DEXA: 1/25 osteopenia   Obstetric History OB History  Gravida Para Term Preterm AB Living  1 0   1 0  SAB IAB Ectopic Multiple Live Births   1       # Outcome Date GA Lbr Len/2nd Weight Sex Type Anes PTL Lv  1 IAB                03/02/2024    2:02 PM 07/21/2023    1:54 PM  Depression screen PHQ 2/9  Decreased Interest 0 0  Down, Depressed, Hopeless 0 0  PHQ - 2 Score 0 0     The following portions of the patient's history were reviewed and updated as appropriate: allergies, current medications, past family history, past medical history, past social history, past surgical history, and problem list.  Review of Systems Pertinent items noted in HPI and remainder of comprehensive ROS otherwise negative.  Past medical history, past surgical history, family history and social history were all reviewed and documented in the EPIC chart.  Exam:  Vitals:   03/02/24 1401  BP: 122/84  Pulse: 81  SpO2: 96%  Weight: 149 lb (67.6 kg)  Height: 5' 3.75 (1.619 m)   Body mass index is 25.78 kg/m.  General appearance:  Normal Thyroid :  Symmetrical, normal in size, without palpable masses or nodularity. Respiratory  Auscultation:  Clear without wheezing or rhonchi Cardiovascular  Auscultation:  Regular rate, without rubs, murmurs or gallops  Edema/varicosities:  Not grossly evident Abdominal  Soft,nontender, without masses, guarding or rebound.  Liver/spleen:  No organomegaly noted  Hernia:  None  appreciated  Skin  Inspection:  Grossly normal Breasts: Examined lying and sitting.   Right: Without masses, retractions, nipple discharge or axillary adenopathy.   Left: Without masses, retractions, nipple discharge or axillary adenopathy. Genitourinary   Inguinal/mons:  Normal without inguinal adenopathy  External genitalia:  Normal appearing vulva with no masses, tenderness, or lesions  BUS/Urethra/Skene's glands:  Normal  Vagina:  Normal appearing with normal color and discharge, no lesions. Atrophy: moderate   Cervix:  Normal appearing without discharge or lesions  Uterus:  Normal in size, shape and contour.  Midline and mobile, nontender  Adnexa/parametria:     Rt: Normal in size, without masses or tenderness.   Lt: Normal in size, without masses or tenderness.  Anus and perineum: Normal    Natalie [name], CMA present for exam  Assessment/Plan:   1. Well woman exam with routine gynecological exam (Primary) - Cytology - PAP( [place])  2. Genitourinary syndrome of menopause - estradiol  (ESTRACE  VAGINAL) 0.1 MG/GM vaginal cream; Place 1 g vaginally 3 (three) times a week. Including a thin layer to the vulva and perineum  Dispense: 42.5 g; Refill: 3  3. Depression screening    Return in 1 year for annual or sooner prn.  Natalie [name] B WHNP-BC, 3:14 PM 03/02/2024

## 2024-03-02 NOTE — Patient Instructions (Signed)
 Preventive Care 58-62 Years Old, Female  Preventive care refers to lifestyle choices and visits with your health care provider that can promote health and wellness. Preventive care visits are also called wellness exams.  What can I expect for my preventive care visit?  Counseling  Your health care provider may ask you questions about your:  Medical history, including:  Past medical problems.  Family medical history.  Pregnancy history.  Current health, including:  Menstrual cycle.  Method of birth control.  Emotional well-being.  Home life and relationship well-being.  Sexual activity and sexual health.  Lifestyle, including:  Alcohol, nicotine or tobacco, and drug use.  Access to firearms.  Diet, exercise, and sleep habits.  Work and work Astronomer.  Sunscreen use.  Safety issues such as seatbelt and bike helmet use.  Physical exam  Your health care provider will check your:  Height and weight. These may be used to calculate your BMI (body mass index). BMI is a measurement that tells if you are at a healthy weight.  Waist circumference. This measures the distance around your waistline. This measurement also tells if you are at a healthy weight and may help predict your risk of certain diseases, such as type 2 diabetes and high blood pressure.  Heart rate and blood pressure.  Body temperature.  Skin for abnormal spots.  What immunizations do I need?    Vaccines are usually given at various ages, according to a schedule. Your health care provider will recommend vaccines for you based on your age, medical history, and lifestyle or other factors, such as travel or where you work.  What tests do I need?  Screening  Your health care provider may recommend screening tests for certain conditions. This may include:  Lipid and cholesterol levels.  Diabetes screening. This is done by checking your blood sugar (glucose) after you have not eaten for a while (fasting).  Pelvic exam and Pap test.  Hepatitis B test.  Hepatitis C  test.  HIV (human immunodeficiency virus) test.  STI (sexually transmitted infection) testing, if you are at risk.  Lung cancer screening.  Colorectal cancer screening.  Mammogram. Talk with your health care provider about when you should start having regular mammograms. This may depend on whether you have a family history of breast cancer.  BRCA-related cancer screening. This may be done if you have a family history of breast, ovarian, tubal, or peritoneal cancers.  Bone density scan. This is done to screen for osteoporosis.  Talk with your health care provider about your test results, treatment options, and if necessary, the need for more tests.  Follow these instructions at home:  Eating and drinking    Eat a diet that includes fresh fruits and vegetables, whole grains, lean protein, and low-fat dairy products.  Take vitamin and mineral supplements as recommended by your health care provider.  Do not drink alcohol if:  Your health care provider tells you not to drink.  You are pregnant, may be pregnant, or are planning to become pregnant.  If you drink alcohol:  Limit how much you have to 0-1 drink a day.  Know how much alcohol is in your drink. In the [place] one drink equals one 12 oz bottle of beer (355 mL), one 5 oz glass of wine (148 mL), or one 1 oz glass of hard liquor (44 mL).  Lifestyle  Brush your teeth every morning and night with fluoride toothpaste. Floss one time each day.  Exercise for at least  30 minutes 5 or more days each week.  Do not use any products that contain nicotine or tobacco. These products include cigarettes, chewing tobacco, and vaping devices, such as e-cigarettes. If you need help quitting, ask your health care provider.  Do not use drugs.  If you are sexually active, practice safe sex. Use a condom or other form of protection to prevent STIs.  If you do not wish to become pregnant, use a form of birth control. If you plan to become pregnant, see your health care provider for a  prepregnancy visit.  Take aspirin only as told by your health care provider. Make sure that you understand how much to take and what form to take. Work with your health care provider to find out whether it is safe and beneficial for you to take aspirin daily.  Find healthy ways to manage stress, such as:  Meditation, yoga, or listening to music.  Journaling.  Talking to a trusted person.  Spending time with friends and family.  Minimize exposure to UV radiation to reduce your risk of skin cancer.  Safety  Always wear your seat belt while driving or riding in a vehicle.  Do not drive:  If you have been drinking alcohol. Do not ride with someone who has been drinking.  When you are tired or distracted.  While texting.  If you have been using any mind-altering substances or drugs.  Wear a helmet and other protective equipment during sports activities.  If you have firearms in your house, make sure you follow all gun safety procedures.  Seek help if you have been physically or sexually abused.  What's next?  Visit your health care provider once a year for an annual wellness visit.  Ask your health care provider how often you should have your eyes and teeth checked.  Stay up to date on all vaccines.  This information is not intended to replace advice given to you by your health care provider. Make sure you discuss any questions you have with your health care provider.  Document Revised: 12/25/2020 Document Reviewed: 12/25/2020  Elsevier Patient Education  2024 [employer].

## 2024-03-03 ENCOUNTER — Ambulatory Visit: Payer: Self-pay | Admitting: Radiology

## 2024-03-03 LAB — CYTOLOGY - PAP
Adequacy: ABSENT
Comment: NEGATIVE
Diagnosis: NEGATIVE
High risk HPV: NEGATIVE

## 2024-03-06 ENCOUNTER — Ambulatory Visit
Admission: RE | Admit: 2024-03-06 | Discharge: 2024-03-06 | Disposition: A | Discharge status: Home / Self Care | Source: Ambulatory Visit | Attending: Radiology | Admitting: Radiology

## 2024-03-06 DIAGNOSIS — Z1231 Encounter for screening mammogram for malignant neoplasm of breast: Secondary | ICD-10-CM | POA: Diagnosis not present

## 2024-03-15 ENCOUNTER — Encounter: Payer: Self-pay | Admitting: Orthopaedic Surgery

## 2024-03-17 ENCOUNTER — Encounter: Payer: Self-pay | Admitting: Orthopaedic Surgery

## 2024-03-28 ENCOUNTER — Ambulatory Visit: Attending: Family Medicine | Admitting: Physical Therapy

## 2024-03-28 ENCOUNTER — Other Ambulatory Visit (HOSPITAL_BASED_OUTPATIENT_CLINIC_OR_DEPARTMENT_OTHER): Payer: Self-pay

## 2024-03-28 DIAGNOSIS — N393 Stress incontinence (female) (male): Secondary | ICD-10-CM | POA: Insufficient documentation

## 2024-03-28 MED ORDER — COMIRNATY 30 MCG/0.3ML IM SUSY
0.3000 mL | PREFILLED_SYRINGE | Freq: Once | INTRAMUSCULAR | 0 refills | Status: AC
  Filled 2024-03-28: qty 0.3, 1d supply, fill #0

## 2024-03-28 MED ORDER — FLUZONE 0.5 ML IM SUSY
0.5000 mL | PREFILLED_SYRINGE | Freq: Once | INTRAMUSCULAR | 0 refills | Status: AC
  Filled 2024-03-28: qty 0.5, 1d supply, fill #0

## 2024-03-28 NOTE — Therapy (Unsigned)
 OUTPATIENT PHYSICAL THERAPY FEMALE PELVIC TREATMENT   Patient Name: Natalie [name] MRN: [id number] DOB:19-May-1961, 63 y.o., female Today's Date: 03/29/2024  END OF SESSION:  PT End of Session - 03/28/24 1700     Visit Number 2    Date for PT Re-Evaluation 08/23/24    Authorization Type [employer]    PT Start Time 1615    PT Stop Time 1654    PT Time Calculation (min) 39 min    Activity Tolerance Patient tolerated treatment well    Behavior During Therapy [facility] for tasks assessed/performed           Past Medical History:  Diagnosis Date   Abnormal Pap smear of cervix 1996   Abnormal uterine bleeding    Allergy    seasonal   Anxiety    past   Arthritis    Bunion    Bursitis of hip 07/2009   left   Constipation 12/24/2010   Fracture of arm 2004   Hallux rigidus, right foot 10/05/2017   Hip pain, left 12/24/2010   HPV test positive    HSV-1 (herpes simplex virus 1) infection    Keratoconjunctivitis sicca not specified as Sjogren's, bilateral 03/28/2018   Lipoma of arm 02/14/2013   Mononucleosis    Myopia with presbyopia of both eyes 03/28/2018   Recurrent oral ulcers 07/22/2011   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   Vitamin D  deficiency 08/2010   Past Surgical History:  Procedure Laterality Date   APPENDECTOMY  63 yrs old   CERVICAL BIOPSY  W/ LOOP ELECTRODE EXCISION  07/13/1994   LGSIL   COLONOSCOPY  09/23/2012   INTRAUTERINE DEVICE INSERTION  09/24/1997   Paraguard   Patient Active Problem List   Diagnosis Date Noted   Osteopenia 01/31/2024   (BMI 25.0-29.9)- E66.3 07/21/2023   Constipation 07/21/2023   Family history of heart disease 07/21/2023   Long term current use of hormonal contraceptive 07/21/2023   Polydipsia 07/21/2023   Dry skin 07/21/2023    PCP: Catherine Charlies LABOR, DO   REFERRING PROVIDER: Joane Artist RAMAN, MD  REFERRING DIAG: [phone] (ICD-10-CM) - Pelvic floor dysfunction  THERAPY DIAG:  Stress incontinence  Rationale  for Evaluation and Treatment: Rehabilitation  ONSET DATE: 5 years ago  SUBJECTIVE:                                                                                                                                                                                           SUBJECTIVE STATEMENT: Have been working out at gym 5x weekly, working on hip and core strength. Has plans for surgery Lt THA 04/14/24. Now able  to complete elliptical 45 mins without urinary incontinence, greatly less with coughing, sneezing, laughing. Does report she does feel like she still has night leakage during sleep but unsure  Started estrogen cream externally and this has helped skin health a lot.   PAIN:  Are you having pain? Yes NPRS scale: 0-5-7/10 Pain location: Lt hip and anterior pelvis  Pain type: aching and stingy Pain description: intermittent   Aggravating factors: walking on incline, car transfers, low seat, stairs Relieving factors: rest or meds if needed  PRECAUTIONS: Other: Osteopenia  RED FLAGS: None   WEIGHT BEARING RESTRICTIONS: No  FALLS:  Has patient fallen in last 6 months? No  OCCUPATION: lab director   ACTIVITY LEVEL : moderate- high (works out 5 days weekly)  PLOF: Independent  PATIENT GOALS: to have less hip pain and less urinary incontinence   PERTINENT HISTORY:  HPV test positive, HSV-1 (herpes simplex virus 1) infection, Anxiety, CERVICAL BIOPSY  W/ LOOP ELECTRODE EXCISION LGSIL, Osteopenia, Constipation,  Sexual abuse: No  BOWEL MOVEMENT: Pain with bowel movement: No Type of bowel movement:Type (Bristol Stool Scale) 1, 3-5, Frequency 1 full bowel movement weekly, type 1s daily, and Strain no Fully empty rectum: Yes: 1-2x weekly but not all bowel movements Leakage: No but has had type one small stool expelled during intercourse before Pads: No Fiber supplement/laxative No  URINATION: Pain with urination: No Fully empty bladder: Yes:   Stream: Strong Urgency: Yes   Frequency: does have to empty prior and after workouts or will have leakage, not quicker than every 2 hours, sometimes 0-2x Leakage: Urge to void, Coughing, Sneezing, Laughing, and Exercise Pads: No  INTERCOURSE:  Ability to have vaginal penetration Yes  Pain with intercourse: at hip DrynessYes  Climax: not painful Marinoff Scale: 1/3  PREGNANCY: Vaginal deliveries 0 C-section deliveries 0 Currently pregnant No  PROLAPSE: None   OBJECTIVE:  Note: Objective measures were completed at Evaluation unless otherwise noted.  DIAGNOSTIC FINDINGS:  DG HIP UNILAT 01/31/24 FINDINGS: Pelvic ring is intact. Degenerative changes of the hip joints are noted bilaterally. No acute fracture or dislocation is noted. No soft tissue changes are seen.   IMPRESSION: Mild degenerative change without acute abnormality.   Urogenital Distress Inventory (UDI-6 Short Form) Score = 63  COGNITION: Overall cognitive status: Within functional limits for tasks assessed     SENSATION: Light touch: Appears intact  LUMBAR SPECIAL TESTS:  SI Compression/distraction test: slightly decreased pain with compression, FABER test: Positive, and Gaenslen's test: Negative (+) on Lt FUNCTIONAL TESTS:  Functional squat with decreased mobility on Lt hip, weight shift to Rt, and decreased descent by 25%  GAIT: WFL  POSTURE: rounded shoulders, forward head, and posterior pelvic tilt   LUMBARAROM/PROM:  A/PROM A/PROM  eval  Flexion WFL  Extension WFL  Right lateral flexion Limited by 25%  Left lateral flexion Limited by 25%  Right rotation WFL  Left rotation WFL   (Blank rows = not tested)  LOWER EXTREMITY ROM:  Lt hip painful with end range motion and limited by 75% with hip ER, right hip WFL with 25% limitation in hip ER  LOWER EXTREMITY MMT:  Lt hip grossly 4/5 but with pain in jt throughout, rt hip grossly 4/5 PALPATION:   General: tightness noted in bil lumbar paraspinals, Lt piriformis,  Lt SIJ, Lt gluteal, Lt proximal adductor   Pelvic Alignment: WFL  Abdominal: tightness in all quadrants but without pain  External Perineal Exam: mild dryness                              Internal Pelvic Floor: replicated pain with palpation of Lt obturator internus and made worse with rotation of Lt hip, Rt hip also has TTP at obturator but greatly less  Patient confirms identification and approves PT to assess internal pelvic floor and treatment Yes No emotional/communication barriers or cognitive limitation. Patient is motivated to learn. Patient understands and agrees with treatment goals and plan. PT explains patient will be examined in standing, sitting, and lying down to see how their muscles and joints work. When they are ready, they will be asked to remove their underwear so PT can examine their perineum. The patient is also given the option of providing their own chaperone as one is not provided in our facility. The patient also has the right and is explained the right to defer or refuse any part of the evaluation or treatment including the internal exam. With the patient's consent, PT will use one gloved finger to gently assess the muscles of the pelvic floor, seeing how well it contracts and relaxes and if there is muscle symmetry. After, the patient will get dressed and PT and patient will discuss exam findings and plan of care. PT and patient discuss plan of care, schedule, attendance policy and HEP activities.  PELVIC MMT:   MMT eval  Vaginal (No cues - 0/5) with cues - 3/5 4s, 5 reps  Internal Anal Sphincter   External Anal Sphincter   Puborectalis   Diastasis Recti   (Blank rows = not tested)        TONE: Increased in deep layer bil  PROLAPSE: Not seen in hooklying with coughing   TODAY'S TREATMENT:                                                                                                                              DATE:   EVAL Examination  completed, findings reviewed, pt educated on POC, HEP, and female pelvic floor anatomy, breathing and pelvic floor coordination with proper techniques for activation without compensatory strategies with good effect.. Pt motivated to participate in PT and agreeable to attempt recommendations.    03/28/24: PT updated HEP and reviewed with pt - due to pt's scheduled hip replacement in a few weeks pt wants to hold on PFPT but has appointment schedule in Nov if needs continue. Reviewed pt's progress, updated goals.  Pt educated on voiding mechanics, abdominal massage, breathing mechanics, and continued knack as needed for stress urinary incontinence.  Pt denied questions.  PATIENT EDUCATION:  Education details: [id number] Person educated: Patient Education method: Explanation, Demonstration, Tactile cues, Verbal cues, and Handouts Education comprehension: verbalized understanding, returned demonstration, verbal cues required, tactile cues required, and needs further education  HOME EXERCISE PROGRAM: [id number]  ASSESSMENT:  CLINICAL IMPRESSION: Patient is a 63 y.o.  female  who was seen today for physical therapy treatment for pelvic pain, Lt hip pain, urinary incontinence and intermittent constipation. Pt has seen excellent progress with urinary incontinence. Is greatly limited due to hip pain but has been working out and general strengthening has improved this a lot, she wants to be stronger before surgery. Does still struggle with constipation session educated on this. Pt denied questions. Updated HEP and gave this to pt as well. To implement pelvic floor activation with exercises as able. Pt verbalized understanding is very complaint and plans to continue but needs to take a break from PFPT until hip cleared. Pt would benefit from additional PT to further address deficits.     OBJECTIVE IMPAIRMENTS: decreased activity tolerance, decreased coordination, decreased endurance, decreased mobility, difficulty  walking, decreased ROM, decreased strength, increased fascial restrictions, impaired perceived functional ability, increased muscle spasms, impaired flexibility, improper body mechanics, postural dysfunction, and pain.   ACTIVITY LIMITATIONS: carrying, lifting, bending, standing, squatting, stairs, transfers, continence, and locomotion level  PARTICIPATION LIMITATIONS: cleaning, interpersonal relationship, community activity, occupation, and yard work  PERSONAL FACTORS: Fitness and Time since onset of injury/illness/exacerbation are also affecting patient's functional outcome.   REHAB POTENTIAL: Good  CLINICAL DECISION MAKING: Stable/uncomplicated  EVALUATION COMPLEXITY: Low   GOALS: Goals reviewed with patient? Yes  SHORT TERM GOALS: Target date: 03/20/24  Pt to be I with HEP for carry over and continuing recommendations for improved outcomes.   Baseline: Goal status: MET  2.  Pt will be independent with the knack, urge suppression technique, and double voiding in order to improve bladder habits and decrease urinary incontinence.   Baseline:  Goal status: MET  3.  Pt will be independent with use of squatty potty, relaxed toileting mechanics, and improved bowel movement techniques in order to increase ease of bowel movements and complete evacuation.   Baseline:  Goal status: MET  4.  Pt to be I with coordination of pelvic floor and breathing mechanics for x10 body weight squats for improved lift mechanics without strain at pelvic floor.  Baseline:  Goal status: MET   LONG TERM GOALS: Target date: 04/03/24  Pt to be I with advanced HEP for carry over and continuing recommendations for improved outcomes.   Baseline:  Goal status: on going  2.  Pt to demonstrate at least 5/5 bil hip strength for improved pelvic stability and functional squats without leakage or worse pain.  Baseline:  Goal status: on going  3.  Pt to demonstrate improved coordination of pelvic floor and  breathing mechanics with 10# squat with appropriate synergistic patterns to decrease pain and leakage at least 75% of the time for improved ability to complete a 30 minute workout with strain at pelvic floor and symptoms.    Baseline:  Goal status: MET  4.  Pt to tolerate at least 30 mins of walking without worsening pain due to improved hip and pelvic floor strengthening to improved tolerance to work out goals for patient.  Baseline:  Goal status: on going  5.  Pt will report her bowel movements are complete due to improved bowel habits and evacuation techniques at least 3 bowel movement weekly. .  Baseline:  Goal status: on going  6.  Pt to demonstrate upright midline posture for at least 30 mins without cues for decreased strain at pelvic floor and leakage.  Baseline:  Goal status: on going  PLAN:  PT FREQUENCY: 1x/week  PT DURATION: 8 sessions  PLANNED INTERVENTIONS: 97110-Therapeutic exercises, 97530- Therapeutic  activity, [id number]- Neuromuscular re-education, [phone]- Self Care, 02859- Manual therapy, [phone]- Canalith repositioning, [id number]- Aquatic Therapy, [phone]- Electrical stimulation (manual), [id number]- Vasopneumatic device, [id number]- Ultrasound, [id number]- Traction (mechanical), [id number]- Ionotophoresis 4mg /ml Dexamethasone, 20560 (1-2 muscles), 20561 (3+ muscles)- Dry Needling, Patient/Family education, Taping, Joint mobilization, Spinal mobilization, Scar mobilization, DME instructions, Cryotherapy, Moist heat, and Biofeedback  PLAN FOR NEXT SESSION: hip and core strengthening, obturator strengthening and manual bil, coordination of breathing pelvic floor with strengthening   Darryle Navy, PT, DPT 09/17/258:10 AM  [facility] [address], [address] [place], [place] [address] Phone # [phone] Fax [phone]

## 2024-03-29 ENCOUNTER — Other Ambulatory Visit: Payer: Self-pay | Admitting: Physician Assistant

## 2024-03-29 DIAGNOSIS — Z01818 Encounter for other preprocedural examination: Secondary | ICD-10-CM

## 2024-04-07 NOTE — Progress Notes (Addendum)
 COVID Vaccine Completed:  Date of COVID positive in last 90 days:  PCP - Charlies Bellini, DO Cardiologist - n/a  Chest x-ray - N/A EKG - N/A Stress Test - N/A ECHO - N/A Cardiac Cath - n/a Pacemaker/ICD device last checked:N/A Spinal Cord Stimulator:N/A  Bowel Prep - N/A  Sleep Study - N/A CPAP -   Fasting Blood Sugar - N/A Checks Blood Sugar _____ times a day  Last dose of GLP1 agonist-  N/A GLP1 instructions:  Do not take after     Last dose of SGLT-2 inhibitors-  N/A SGLT-2 instructions:  Do not take after     Blood Thinner Instructions: N/A Last dose:   Time: Aspirin Instructions:N/A Last Dose:  Activity level: Can go up a flight of stairs and perform activities of daily living without stopping and without symptoms of chest pain or shortness of breath.  Anesthesia review: N/A  Patient denies shortness of breath, fever, cough and chest pain at PAT appointment  Patient verbalized understanding of instructions that were given to them at the PAT appointment. Patient was also instructed that they will need to review over the PAT instructions again at home before surgery.

## 2024-04-10 ENCOUNTER — Encounter (HOSPITAL_COMMUNITY)
Admission: RE | Admit: 2024-04-10 | Discharge: 2024-04-10 | Disposition: A | Discharge status: Home / Self Care | Source: Ambulatory Visit | Attending: Orthopaedic Surgery | Admitting: Orthopaedic Surgery

## 2024-04-10 ENCOUNTER — Other Ambulatory Visit: Payer: Self-pay

## 2024-04-10 ENCOUNTER — Encounter (HOSPITAL_COMMUNITY): Payer: Self-pay

## 2024-04-10 ENCOUNTER — Encounter: Admitting: Physical Therapy

## 2024-04-10 VITALS — BP 121/73 | HR 81 | Temp 98.5°F | Resp 16 | Ht 63.25 in | Wt 149.0 lb

## 2024-04-10 DIAGNOSIS — Z01812 Encounter for preprocedural laboratory examination: Secondary | ICD-10-CM | POA: Diagnosis present

## 2024-04-10 DIAGNOSIS — Z01818 Encounter for other preprocedural examination: Secondary | ICD-10-CM

## 2024-04-10 HISTORY — DX: Other injury of unspecified body region, initial encounter: T14.8XXA

## 2024-04-10 HISTORY — DX: Scoliosis, unspecified: M41.9

## 2024-04-10 HISTORY — DX: Family history of other specified conditions: Z84.89

## 2024-04-10 LAB — BASIC METABOLIC PANEL WITH GFR
Anion gap: 10 (ref 5–15)
BUN: 19 mg/dL (ref 8–23)
CO2: 26 mmol/L (ref 22–32)
Calcium: 9.4 mg/dL (ref 8.9–10.3)
Chloride: 102 mmol/L (ref 98–111)
Creatinine, Ser: 0.63 mg/dL (ref 0.44–1.00)
GFR, Estimated: >60 mL/min (ref >60)
Glucose, Bld: 120 mg/dL — ABNORMAL HIGH (ref 70–99)
Potassium: 4.1 mmol/L (ref 3.5–5.1)
Sodium: 138 mmol/L (ref 135–145)

## 2024-04-10 LAB — CBC
HCT: 45.5 % (ref 36.0–46.0)
Hemoglobin: 14.3 g/dL (ref 12.0–15.0)
MCH: 29.1 pg (ref 26.0–34.0)
MCHC: 31.4 g/dL (ref 30.0–36.0)
MCV: 92.7 fL (ref 80.0–100.0)
Platelets: 329 K/uL (ref 150–400)
RBC: 4.91 MIL/uL (ref 3.87–5.11)
RDW: 12.8 % (ref 11.5–15.5)
WBC: 5.7 K/uL (ref 4.0–10.5)
nRBC: 0 % (ref 0.0–0.2)

## 2024-04-10 LAB — SURGICAL PCR SCREEN
MRSA, PCR: NEGATIVE
Staphylococcus aureus: NEGATIVE

## 2024-04-10 NOTE — Patient Instructions (Signed)
 SURGICAL WAITING ROOM VISITATION  Patients having surgery or a procedure may have no more than 2 support people in the waiting area - these visitors may rotate.    Children under the age of 66 must have an adult with them who is not the patient.  Visitors with respiratory illnesses are discouraged from visiting and should remain at home.  If the patient needs to stay at the hospital during part of their recovery, the visitor guidelines for inpatient rooms apply. Pre-op nurse will coordinate an appropriate time for 1 support person to accompany patient in pre-op.  This support person may not rotate.    Please refer to the [facility] website for the visitor guidelines for Inpatients (after your surgery is over and you are in a regular room).    Your procedure is scheduled on: 04/14/24   Report to [facility] Main Entrance    Report to admitting at 5:15 AM   Call this number if you have problems the morning of surgery [phone]   Do not eat food :After Midnight.   After Midnight you may have the following liquids until 4:15 AM DAY OF SURGERY  Water Non-Citrus Juices (without pulp, NO RED-Apple, White grape, White cranberry) Black Coffee (NO MILK/CREAM OR CREAMERS, sugar ok)  Clear Tea (NO MILK/CREAM OR CREAMERS, sugar ok) regular and decaf                             Plain Jell-O (NO RED)                                           Fruit ices (not with fruit pulp, NO RED)                                     Popsicles (NO RED)                                                               Sports drinks like Gatorade (NO RED)                The day of surgery:  Drink ONE (1) Pre-Surgery Clear Ensure at 4:15 AM the morning of surgery. Drink in one sitting. Do not sip.  This drink was given to you during your hospital  pre-op appointment visit. Nothing else to drink after completing the  Pre-Surgery Clear Ensure.          If you have questions, please contact your surgeon's  office.   FOLLOW BOWEL PREP AND ANY ADDITIONAL PRE OP INSTRUCTIONS YOU RECEIVED FROM YOUR SURGEON'S OFFICE!!!     Oral Hygiene is also important to reduce your risk of infection.                                    Remember - BRUSH YOUR TEETH THE MORNING OF SURGERY WITH YOUR REGULAR TOOTHPASTE  DENTURES WILL BE REMOVED PRIOR TO SURGERY PLEASE DO NOT APPLY Poly grip OR ADHESIVES!!!  Stop all vitamins and herbal supplements 7 days before surgery.   Take these medicines the morning of surgery with A SIP OF WATER:  None                              You may not have any metal on your body including hair pins, jewelry, and body piercing             Do not wear make-up, lotions, powders, perfumes, or deodorant  Do not wear nail polish including gel and S&S, artificial/acrylic nails, or any other type of covering on natural nails including finger and toenails. If you have artificial nails, gel coating, etc. that needs to be removed by a nail salon please have this removed prior to surgery or surgery may need to be canceled/ delayed if the surgeon/ anesthesia feels like they are unable to be safely monitored.   Do not shave  48 hours prior to surgery.    Do not bring valuables to the hospital. [facility] IS NOT             RESPONSIBLE   FOR VALUABLES.   Contacts, glasses, dentures or bridgework may not be worn into surgery.   Bring small overnight bag day of surgery.   DO NOT BRING YOUR HOME MEDICATIONS TO THE HOSPITAL. PHARMACY WILL DISPENSE MEDICATIONS LISTED ON YOUR MEDICATION LIST TO YOU DURING YOUR ADMISSION IN THE HOSPITAL!              Please read over the following fact sheets you were given: IF YOU HAVE QUESTIONS ABOUT YOUR PRE-OP INSTRUCTIONS PLEASE CALL [phone].   If you received a COVID test during your pre-op visit  it is requested that you wear a mask when out in public, stay away from anyone that may not be feeling well and notify your surgeon if you develop  symptoms. If you test positive for Covid or have been in contact with anyone that has tested positive in the last 10 days please notify you surgeon.      Pre-operative 5 CHG Bath Instructions   You can play a key role in reducing the risk of infection after surgery. Your skin needs to be as free of germs as possible. You can reduce the number of germs on your skin by washing with CHG (chlorhexidine gluconate) soap before surgery. CHG is an antiseptic soap that kills germs and continues to kill germs even after washing.   DO NOT use if you have an allergy to chlorhexidine/CHG or antibacterial soaps. If your skin becomes reddened or irritated, stop using the CHG and notify one of our RNs at [phone].   Please shower with the CHG soap starting 4 days before surgery using the following schedule:     Please keep in mind the following:  DO NOT shave, including legs and underarms, starting the day of your first shower.   You may shave your face at any point before/day of surgery.  Place clean sheets on your bed the day you start using CHG soap. Use a clean washcloth (not used since being washed) for each shower. DO NOT sleep with pets once you start using the CHG.   CHG Shower Instructions:  If you choose to wash your hair and private area, wash first with your normal shampoo/soap.  After you use shampoo/soap, rinse your hair and body thoroughly to remove shampoo/soap residue.  Turn the water OFF and  apply about 3 tablespoons (45 ml) of CHG soap to a CLEAN washcloth.  Apply CHG soap ONLY FROM YOUR NECK DOWN TO YOUR TOES (washing for 3-5 minutes)  DO NOT use CHG soap on face, private areas, open wounds, or sores.  Pay special attention to the area where your surgery is being performed.  If you are having back surgery, having someone wash your back for you may be helpful. Wait 2 minutes after CHG soap is applied, then you may rinse off the CHG soap.  Pat dry with a clean towel  Put on clean  clothes/pajamas   If you choose to wear lotion, please use ONLY the CHG-compatible lotions on the back of this paper.     Additional instructions for the day of surgery: DO NOT APPLY any lotions, deodorants, cologne, or perfumes.   Put on clean/comfortable clothes.  Brush your teeth.  Ask your nurse before applying any prescription medications to the skin.      CHG Compatible Lotions   Aveeno Moisturizing lotion  Cetaphil Moisturizing Cream  Cetaphil Moisturizing Lotion  Clairol Herbal Essence Moisturizing Lotion, Dry Skin  Clairol Herbal Essence Moisturizing Lotion, Extra Dry Skin  Clairol Herbal Essence Moisturizing Lotion, Normal Skin  Curel Age Defying Therapeutic Moisturizing Lotion with Alpha Hydroxy  Curel Extreme Care Body Lotion  Curel Soothing Hands Moisturizing Hand Lotion  Curel Therapeutic Moisturizing Cream, Fragrance-Free  Curel Therapeutic Moisturizing Lotion, Fragrance-Free  Curel Therapeutic Moisturizing Lotion, Original Formula  Eucerin Daily Replenishing Lotion  Eucerin Dry Skin Therapy Plus Alpha Hydroxy Crme  Eucerin Dry Skin Therapy Plus Alpha Hydroxy Lotion  Eucerin Original Crme  Eucerin Original Lotion  Eucerin Plus Crme Eucerin Plus Lotion  Eucerin TriLipid Replenishing Lotion  Keri Anti-Bacterial Hand Lotion  Keri Deep Conditioning Original Lotion Dry Skin Formula Softly Scented  Keri Deep Conditioning Original Lotion, Fragrance Free Sensitive Skin Formula  Keri Lotion Fast Absorbing Fragrance Free Sensitive Skin Formula  Keri Lotion Fast Absorbing Softly Scented Dry Skin Formula  Keri Original Lotion  Keri Skin Renewal Lotion Keri Silky Smooth Lotion  Keri Silky Smooth Sensitive Skin Lotion  Nivea Body Creamy Conditioning Oil  Nivea Body Extra Enriched Lotion  Nivea Body Original Lotion  Nivea Body Sheer Moisturizing Lotion Nivea Crme  Nivea Skin Firming Lotion  NutraDerm 30 Skin Lotion  NutraDerm Skin Lotion  NutraDerm Therapeutic  Skin Cream  NutraDerm Therapeutic Skin Lotion  ProShield Protective Hand Cream  Provon moisturizing lotion   Incentive Spirometer  An incentive spirometer is a tool that can help keep your lungs clear and active. This tool measures how well you are filling your lungs with each breath. Taking long deep breaths may help reverse or decrease the chance of developing breathing (pulmonary) problems (especially infection) following: A long period of time when you are unable to move or be active. BEFORE THE PROCEDURE  If the spirometer includes an indicator to show your best effort, your nurse or respiratory therapist will set it to a desired goal. If possible, sit up straight or lean slightly forward. Try not to slouch. Hold the incentive spirometer in an upright position. INSTRUCTIONS FOR USE  Sit on the edge of your bed if possible, or sit up as far as you can in bed or on a chair. Hold the incentive spirometer in an upright position. Breathe out normally. Place the mouthpiece in your mouth and seal your lips tightly around it. Breathe in slowly and as deeply as possible, raising the piston or the ball  toward the top of the column. Hold your breath for 3-5 seconds or for as long as possible. Allow the piston or ball to fall to the bottom of the column. Remove the mouthpiece from your mouth and breathe out normally. Rest for a few seconds and repeat Steps 1 through 7 at least 10 times every 1-2 hours when you are awake. Take your time and take a few normal breaths between deep breaths. The spirometer may include an indicator to show your best effort. Use the indicator as a goal to work toward during each repetition. After each set of 10 deep breaths, practice coughing to be sure your lungs are clear. If you have an incision (the cut made at the time of surgery), support your incision when coughing by placing a pillow or rolled up towels firmly against it. Once you are able to get out of bed, walk  around indoors and cough well. You may stop using the incentive spirometer when instructed by your caregiver.  RISKS AND COMPLICATIONS Take your time so you do not get dizzy or light-headed. If you are in pain, you may need to take or ask for pain medication before doing incentive spirometry. It is harder to take a deep breath if you are having pain. AFTER USE Rest and breathe slowly and easily. It can be helpful to keep track of a log of your progress. Your caregiver can provide you with a simple table to help with this. If you are using the spirometer at home, follow these instructions: SEEK MEDICAL CARE IF:  You are having difficultly using the spirometer. You have trouble using the spirometer as often as instructed. Your pain medication is not giving enough relief while using the spirometer. You develop fever of 100.5 F (38.1 C) or higher. SEEK IMMEDIATE MEDICAL CARE IF:  You cough up bloody sputum that had not been present before. You develop fever of 102 F (38.9 C) or greater. You develop worsening pain at or near the incision site. MAKE SURE YOU:  Understand these instructions. Will watch your condition. Will get help right away if you are not doing well or get worse. Document Released: 11/09/2006 Document Revised: 09/21/2011 Document Reviewed: 01/10/2007 ExitCare Patient Information 2014 Exit[employer], [place].   ________________________________________________________________________ WHAT IS A BLOOD TRANSFUSION? Blood Transfusion Information  A transfusion is the replacement of blood or some of its parts. Blood is made up of multiple cells which provide different functions. Red blood cells carry oxygen and are used for blood loss replacement. White blood cells fight against infection. Platelets control bleeding. Plasma helps clot blood. Other blood products are available for specialized needs, such as hemophilia or other clotting disorders. BEFORE THE TRANSFUSION  Who gives  blood for transfusions?  Healthy volunteers who are fully evaluated to make sure their blood is safe. This is blood bank blood. Transfusion therapy is the safest it has ever been in the practice of medicine. Before blood is taken from a donor, a complete history is taken to make sure that person has no history of diseases nor engages in risky social behavior (examples are intravenous drug use or sexual activity with multiple partners). The donor's travel history is screened to minimize risk of transmitting infections, such as malaria. The donated blood is tested for signs of infectious diseases, such as HIV and hepatitis. The blood is then tested to be sure it is compatible with you in order to minimize the chance of a transfusion reaction. If you or a relative donates  blood, this is often done in anticipation of surgery and is not appropriate for emergency situations. It takes many days to process the donated blood. RISKS AND COMPLICATIONS Although transfusion therapy is very safe and saves many lives, the main dangers of transfusion include:  Getting an infectious disease. Developing a transfusion reaction. This is an allergic reaction to something in the blood you were given. Every precaution is taken to prevent this. The decision to have a blood transfusion has been considered carefully by your caregiver before blood is given. Blood is not given unless the benefits outweigh the risks. AFTER THE TRANSFUSION Right after receiving a blood transfusion, you will usually feel much better and more energetic. This is especially true if your red blood cells have gotten low (anemic). The transfusion raises the level of the red blood cells which carry oxygen, and this usually causes an energy increase. The nurse administering the transfusion will monitor you carefully for complications. HOME CARE INSTRUCTIONS  No special instructions are needed after a transfusion. You may find your energy is better. Speak with  your caregiver about any limitations on activity for underlying diseases you may have. SEEK MEDICAL CARE IF:  Your condition is not improving after your transfusion. You develop redness or irritation at the intravenous (IV) site. SEEK IMMEDIATE MEDICAL CARE IF:  Any of the following symptoms occur over the next 12 hours: Shaking chills. You have a temperature by mouth above 102 F (38.9 C), not controlled by medicine. Chest, back, or muscle pain. People around you feel you are not acting correctly or are confused. Shortness of breath or difficulty breathing. Dizziness and fainting. You get a rash or develop hives. You have a decrease in urine output. Your urine turns a dark color or changes to pink, red, or brown. Any of the following symptoms occur over the next 10 days: You have a temperature by mouth above 102 F (38.9 C), not controlled by medicine. Shortness of breath. Weakness after normal activity. The white part of the eye turns yellow (jaundice). You have a decrease in the amount of urine or are urinating less often. Your urine turns a dark color or changes to pink, red, or brown. Document Released: 06/26/2000 Document Revised: 09/21/2011 Document Reviewed: 02/13/2008 [facility] Patient Information 2014 [place], [place].  _______________________________________________________________________

## 2024-04-13 DIAGNOSIS — M1612 Unilateral primary osteoarthritis, left hip: Principal | ICD-10-CM | POA: Insufficient documentation
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
## 2024-04-14 ENCOUNTER — Encounter (HOSPITAL_COMMUNITY)
Admission: RE | Disposition: A | Discharge status: Home Health | Payer: Self-pay | Source: Home / Self Care | Attending: Orthopaedic Surgery

## 2024-04-14 ENCOUNTER — Inpatient Hospital Stay (HOSPITAL_COMMUNITY)
Admission: RE | Admit: 2024-04-14 | Discharge: 2024-04-16 | DRG: 470 | Disposition: A | Discharge status: Home Health | Attending: Orthopaedic Surgery | Admitting: Orthopaedic Surgery

## 2024-04-14 ENCOUNTER — Ambulatory Visit (HOSPITAL_COMMUNITY)

## 2024-04-14 ENCOUNTER — Observation Stay (HOSPITAL_COMMUNITY)

## 2024-04-14 ENCOUNTER — Other Ambulatory Visit: Payer: Self-pay

## 2024-04-14 ENCOUNTER — Encounter (HOSPITAL_COMMUNITY): Payer: Self-pay | Admitting: Orthopaedic Surgery

## 2024-04-14 ENCOUNTER — Encounter (HOSPITAL_COMMUNITY): Payer: Self-pay | Admitting: Physician Assistant

## 2024-04-14 ENCOUNTER — Ambulatory Visit (HOSPITAL_COMMUNITY): Admitting: Anesthesiology

## 2024-04-14 DIAGNOSIS — Z8249 Family history of ischemic heart disease and other diseases of the circulatory system: Secondary | ICD-10-CM

## 2024-04-14 DIAGNOSIS — Z96642 Presence of left artificial hip joint: Secondary | ICD-10-CM

## 2024-04-14 DIAGNOSIS — M1612 Unilateral primary osteoarthritis, left hip: Secondary | ICD-10-CM | POA: Diagnosis not present

## 2024-04-14 DIAGNOSIS — Z87891 Personal history of nicotine dependence: Secondary | ICD-10-CM

## 2024-04-14 DIAGNOSIS — E559 Vitamin D deficiency, unspecified: Secondary | ICD-10-CM | POA: Diagnosis present

## 2024-04-14 DIAGNOSIS — Z8262 Family history of osteoporosis: Secondary | ICD-10-CM

## 2024-04-14 DIAGNOSIS — M419 Scoliosis, unspecified: Secondary | ICD-10-CM | POA: Diagnosis present

## 2024-04-14 DIAGNOSIS — F419 Anxiety disorder, unspecified: Secondary | ICD-10-CM | POA: Diagnosis present

## 2024-04-14 DIAGNOSIS — Z888 Allergy status to other drugs, medicaments and biological substances status: Secondary | ICD-10-CM

## 2024-04-14 DIAGNOSIS — Z9181 History of falling: Secondary | ICD-10-CM

## 2024-04-14 DIAGNOSIS — Z825 Family history of asthma and other chronic lower respiratory diseases: Secondary | ICD-10-CM

## 2024-04-14 DIAGNOSIS — Z8261 Family history of arthritis: Secondary | ICD-10-CM

## 2024-04-14 DIAGNOSIS — Z823 Family history of stroke: Secondary | ICD-10-CM

## 2024-04-14 DIAGNOSIS — Z471 Aftercare following joint replacement surgery: Secondary | ICD-10-CM | POA: Diagnosis not present

## 2024-04-14 DIAGNOSIS — M8580 Other specified disorders of bone density and structure, unspecified site: Secondary | ICD-10-CM | POA: Diagnosis present

## 2024-04-14 HISTORY — PX: TOTAL HIP ARTHROPLASTY: SHX124

## 2024-04-14 LAB — TYPE AND SCREEN
ABO/RH(D): O POS
Antibody Screen: NEGATIVE

## 2024-04-14 LAB — ABO/RH: ABO/RH(D): O POS

## 2024-04-14 SURGERY — ARTHROPLASTY, HIP, TOTAL, ANTERIOR APPROACH
Anesthesia: Monitor Anesthesia Care | Site: Hip | Laterality: Left

## 2024-04-14 MED ORDER — OXYCODONE HCL 5 MG PO TABS
5.0000 mg | ORAL_TABLET | ORAL | Status: DC | PRN
  Administered 2024-04-14 – 2024-04-16 (×9): 5 mg via ORAL
  Filled 2024-04-14: qty 1
  Filled 2024-04-14: qty 2
  Filled 2024-04-14 (×6): qty 1
  Filled 2024-04-14: qty 2

## 2024-04-14 MED ORDER — TRANEXAMIC ACID-NACL 1000-0.7 MG/100ML-% IV SOLN
1000.0000 mg | INTRAVENOUS | Status: DC
  Filled 2024-04-14: qty 100

## 2024-04-14 MED ORDER — PHENYLEPHRINE HCL (PRESSORS) 10 MG/ML IV SOLN
INTRAVENOUS | Status: AC
  Filled 2024-04-14: qty 1

## 2024-04-14 MED ORDER — HYDROMORPHONE HCL 1 MG/ML IJ SOLN: 0.5000 mg | INTRAMUSCULAR | Status: DC | PRN

## 2024-04-14 MED ORDER — ONDANSETRON HCL 4 MG/2ML IJ SOLN: 4.0000 mg | Freq: Once | INTRAMUSCULAR | Status: DC | PRN

## 2024-04-14 MED ORDER — ONDANSETRON HCL 4 MG/2ML IJ SOLN
INTRAMUSCULAR | Status: AC
  Filled 2024-04-14: qty 2

## 2024-04-14 MED ORDER — ACETAMINOPHEN 10 MG/ML IV SOLN: 1000.0000 mg | Freq: Once | INTRAVENOUS | Status: DC | PRN

## 2024-04-14 MED ORDER — CEFAZOLIN SODIUM-DEXTROSE 2-4 GM/100ML-% IV SOLN
2.0000 g | INTRAVENOUS | Status: AC
Start: 2024-04-14 — End: 2024-04-14
  Administered 2024-04-14: 2 g via INTRAVENOUS
  Filled 2024-04-14: qty 100

## 2024-04-14 MED ORDER — PHENYLEPHRINE 80 MCG/ML (10ML) SYRINGE FOR IV PUSH (FOR BLOOD PRESSURE SUPPORT)
PREFILLED_SYRINGE | INTRAVENOUS | Status: DC | PRN
Start: 2024-04-14 — End: 2024-04-14
  Administered 2024-04-14: 80 ug via INTRAVENOUS

## 2024-04-14 MED ORDER — FENTANYL CITRATE PF 50 MCG/ML IJ SOSY: 25.0000 ug | PREFILLED_SYRINGE | INTRAMUSCULAR | Status: DC | PRN

## 2024-04-14 MED ORDER — SODIUM CHLORIDE 0.9 % IR SOLN
Status: DC | PRN
  Administered 2024-04-14: 1000 mL

## 2024-04-14 MED ORDER — 0.9 % SODIUM CHLORIDE (POUR BTL) OPTIME
TOPICAL | Status: DC | PRN
  Administered 2024-04-14: 1000 mL

## 2024-04-14 MED ORDER — DOCUSATE SODIUM 100 MG PO CAPS
100.0000 mg | ORAL_CAPSULE | Freq: Two times a day (BID) | ORAL | Status: DC
  Administered 2024-04-14 – 2024-04-16 (×5): 100 mg via ORAL
  Filled 2024-04-14 (×5): qty 1

## 2024-04-14 MED ORDER — CHLORHEXIDINE GLUCONATE 0.12 % MT SOLN
15.0000 mL | Freq: Once | OROMUCOSAL | Status: AC
  Administered 2024-04-14: 15 mL via OROMUCOSAL

## 2024-04-14 MED ORDER — ONDANSETRON HCL 4 MG PO TABS
4.0000 mg | ORAL_TABLET | Freq: Four times a day (QID) | ORAL | Status: DC | PRN
  Administered 2024-04-15 – 2024-04-16 (×2): 4 mg via ORAL
  Filled 2024-04-14 (×2): qty 1

## 2024-04-14 MED ORDER — SODIUM CHLORIDE 0.9 % IV SOLN: INTRAVENOUS | Status: DC

## 2024-04-14 MED ORDER — ASPIRIN 81 MG PO CHEW
81.0000 mg | CHEWABLE_TABLET | Freq: Two times a day (BID) | ORAL | Status: DC
  Administered 2024-04-14 – 2024-04-16 (×4): 81 mg via ORAL
  Filled 2024-04-14 (×4): qty 1

## 2024-04-14 MED ORDER — LACTATED RINGERS IV SOLN: INTRAVENOUS | Status: DC | PRN

## 2024-04-14 MED ORDER — POVIDONE-IODINE 10 % EX SWAB: 2.0000 | Freq: Once | CUTANEOUS | Status: DC

## 2024-04-14 MED ORDER — ORAL CARE MOUTH RINSE: 15.0000 mL | Freq: Once | OROMUCOSAL | Status: AC

## 2024-04-14 MED ORDER — MIDAZOLAM HCL 2 MG/2ML IJ SOLN
INTRAMUSCULAR | Status: AC
  Filled 2024-04-14: qty 2

## 2024-04-14 MED ORDER — OXYCODONE HCL 5 MG/5ML PO SOLN: 5.0000 mg | Freq: Once | ORAL | Status: DC | PRN

## 2024-04-14 MED ORDER — DEXMEDETOMIDINE HCL IN NACL 80 MCG/20ML IV SOLN
INTRAVENOUS | Status: DC | PRN
Start: 2024-04-14 — End: 2024-04-14
  Administered 2024-04-14 (×3): 4 ug via INTRAVENOUS

## 2024-04-14 MED ORDER — ONDANSETRON HCL 4 MG/2ML IJ SOLN: 4.0000 mg | Freq: Four times a day (QID) | INTRAMUSCULAR | Status: DC | PRN

## 2024-04-14 MED ORDER — CARMEX CLASSIC LIP BALM EX OINT
TOPICAL_OINTMENT | CUTANEOUS | Status: DC | PRN
  Filled 2024-04-14: qty 10

## 2024-04-14 MED ORDER — OXYCODONE HCL 5 MG PO TABS: 10.0000 mg | ORAL_TABLET | ORAL | Status: DC | PRN

## 2024-04-14 MED ORDER — PHENYLEPHRINE 80 MCG/ML (10ML) SYRINGE FOR IV PUSH (FOR BLOOD PRESSURE SUPPORT)
PREFILLED_SYRINGE | INTRAVENOUS | Status: AC
  Filled 2024-04-14: qty 10

## 2024-04-14 MED ORDER — TRANEXAMIC ACID-NACL 1000-0.7 MG/100ML-% IV SOLN
INTRAVENOUS | Status: DC | PRN
Start: 2024-04-14 — End: 2024-04-14
  Administered 2024-04-14: 1000 mg via INTRAVENOUS

## 2024-04-14 MED ORDER — ONDANSETRON HCL 4 MG/2ML IJ SOLN
INTRAMUSCULAR | Status: DC | PRN
Start: 2024-04-14 — End: 2024-04-14
  Administered 2024-04-14: 4 mg via INTRAVENOUS

## 2024-04-14 MED ORDER — DEXAMETHASONE SODIUM PHOSPHATE 10 MG/ML IJ SOLN
INTRAMUSCULAR | Status: DC | PRN
Start: 2024-04-14 — End: 2024-04-14
  Administered 2024-04-14: 10 mg via INTRAVENOUS

## 2024-04-14 MED ORDER — METHOCARBAMOL 1000 MG/10ML IJ SOLN: 500.0000 mg | Freq: Four times a day (QID) | INTRAMUSCULAR | Status: DC | PRN

## 2024-04-14 MED ORDER — CEFAZOLIN SODIUM-DEXTROSE 2-4 GM/100ML-% IV SOLN
2.0000 g | Freq: Four times a day (QID) | INTRAVENOUS | Status: AC
  Administered 2024-04-14 (×2): 2 g via INTRAVENOUS
  Filled 2024-04-14 (×2): qty 100

## 2024-04-14 MED ORDER — PROPOFOL 10 MG/ML IV BOLUS
INTRAVENOUS | Status: AC
  Filled 2024-04-14: qty 20

## 2024-04-14 MED ORDER — PROPOFOL 10 MG/ML IV BOLUS
INTRAVENOUS | Status: DC | PRN
  Administered 2024-04-14: 100 ug/kg/min via INTRAVENOUS
  Administered 2024-04-14: 50 mg via INTRAVENOUS

## 2024-04-14 MED ORDER — LACTATED RINGERS IV SOLN: INTRAVENOUS | Status: DC

## 2024-04-14 MED ORDER — METOCLOPRAMIDE HCL 5 MG PO TABS: 5.0000 mg | ORAL_TABLET | Freq: Three times a day (TID) | ORAL | Status: DC | PRN

## 2024-04-14 MED ORDER — LIDOCAINE HCL (PF) 2 % IJ SOLN
INTRAMUSCULAR | Status: AC
  Filled 2024-04-14: qty 5

## 2024-04-14 MED ORDER — OXYCODONE HCL 5 MG PO TABS: 5.0000 mg | ORAL_TABLET | Freq: Once | ORAL | Status: DC | PRN

## 2024-04-14 MED ORDER — MENTHOL 3 MG MT LOZG: 1.0000 | LOZENGE | OROMUCOSAL | Status: DC | PRN

## 2024-04-14 MED ORDER — ALUM & MAG HYDROXIDE-SIMETH 200-200-20 MG/5ML PO SUSP: 30.0000 mL | ORAL | Status: DC | PRN

## 2024-04-14 MED ORDER — DEXMEDETOMIDINE HCL IN NACL 80 MCG/20ML IV SOLN
INTRAVENOUS | Status: AC
  Filled 2024-04-14: qty 20

## 2024-04-14 MED ORDER — PANTOPRAZOLE SODIUM 40 MG PO TBEC
40.0000 mg | DELAYED_RELEASE_TABLET | Freq: Every day | ORAL | Status: DC
  Administered 2024-04-14 – 2024-04-16 (×3): 40 mg via ORAL
  Filled 2024-04-14 (×3): qty 1

## 2024-04-14 MED ORDER — POLYVINYL ALCOHOL 1.4 % OP SOLN
1.0000 [drp] | OPHTHALMIC | Status: DC | PRN
  Administered 2024-04-14: 1 [drp] via OPHTHALMIC
  Filled 2024-04-14: qty 15

## 2024-04-14 MED ORDER — ACETAMINOPHEN 325 MG PO TABS
325.0000 mg | ORAL_TABLET | Freq: Four times a day (QID) | ORAL | Status: DC | PRN
  Administered 2024-04-15 – 2024-04-16 (×4): 650 mg via ORAL
  Filled 2024-04-14 (×4): qty 2

## 2024-04-14 MED ORDER — PHENOL 1.4 % MT LIQD: 1.0000 | OROMUCOSAL | Status: DC | PRN

## 2024-04-14 MED ORDER — BUPIVACAINE IN DEXTROSE 0.75-8.25 % IT SOLN
INTRATHECAL | Status: DC | PRN
  Administered 2024-04-14: 1.7 mL via INTRATHECAL

## 2024-04-14 MED ORDER — PROPOFOL 1000 MG/100ML IV EMUL
INTRAVENOUS | Status: AC
Start: 2024-04-14 — End: 2024-04-14
  Filled 2024-04-14: qty 100

## 2024-04-14 MED ORDER — STERILE WATER FOR IRRIGATION IR SOLN
Status: DC | PRN
  Administered 2024-04-14: 2000 mL

## 2024-04-14 MED ORDER — DEXAMETHASONE SODIUM PHOSPHATE 10 MG/ML IJ SOLN
INTRAMUSCULAR | Status: AC
  Filled 2024-04-14: qty 1

## 2024-04-14 MED ORDER — MIDAZOLAM HCL 2 MG/2ML IJ SOLN
INTRAMUSCULAR | Status: DC | PRN
  Administered 2024-04-14: 2 mg via INTRAVENOUS

## 2024-04-14 MED ORDER — METOCLOPRAMIDE HCL 5 MG/ML IJ SOLN
5.0000 mg | Freq: Three times a day (TID) | INTRAMUSCULAR | Status: DC | PRN
  Filled 2024-04-14: qty 2

## 2024-04-14 MED ORDER — PHENYLEPHRINE HCL-NACL 20-0.9 MG/250ML-% IV SOLN
INTRAVENOUS | Status: DC | PRN
  Administered 2024-04-14: 50 ug/min via INTRAVENOUS

## 2024-04-14 MED ORDER — METHOCARBAMOL 500 MG PO TABS
500.0000 mg | ORAL_TABLET | Freq: Four times a day (QID) | ORAL | Status: DC | PRN
  Administered 2024-04-14 – 2024-04-16 (×7): 500 mg via ORAL
  Filled 2024-04-14 (×7): qty 1

## 2024-04-14 SURGICAL SUPPLY — 37 items
BAG COUNTER SPONGE SURGICOUNT (BAG) ×1 IMPLANT
BAG ZIPLOCK 12X15 (MISCELLANEOUS) ×1 IMPLANT
BENZOIN TINCTURE PRP APPL 2/3 (GAUZE/BANDAGES/DRESSINGS) IMPLANT
BLADE SAW SGTL 18X1.27X75 (BLADE) ×1 IMPLANT
COVER PERINEAL POST (MISCELLANEOUS) ×1 IMPLANT
COVER SURGICAL LIGHT HANDLE (MISCELLANEOUS) ×1 IMPLANT
CUP SECTOR GRIPTON 50MM (Cup) IMPLANT
DRAPE FOOT SWITCH (DRAPES) ×1 IMPLANT
DRAPE STERI IOBAN 125X83 (DRAPES) ×1 IMPLANT
DRAPE U-SHAPE 47X51 STRL (DRAPES) ×2 IMPLANT
DRSG AQUACEL AG ADV 3.5X10 (GAUZE/BANDAGES/DRESSINGS) ×1 IMPLANT
DURAPREP 26ML APPLICATOR (WOUND CARE) ×1 IMPLANT
ELECT PENCIL ROCKER SW 15FT (MISCELLANEOUS) ×1 IMPLANT
ELECT REM PT RETURN 15FT ADLT (MISCELLANEOUS) ×1 IMPLANT
FEMORAL STEM 12/14 TPR SZ4 HIP (Orthopedic Implant) IMPLANT
GAUZE XEROFORM 1X8 LF (GAUZE/BANDAGES/DRESSINGS) IMPLANT
GLOVE BIO SURGEON STRL SZ7.5 (GLOVE) ×1 IMPLANT
GLOVE BIOGEL PI IND STRL 8 (GLOVE) ×2 IMPLANT
GLOVE ECLIPSE 8.0 STRL XLNG CF (GLOVE) ×1 IMPLANT
GOWN STRL REUS W/ TWL XL LVL3 (GOWN DISPOSABLE) ×2 IMPLANT
HEAD FEMORAL 32 CERAMIC (Hips) IMPLANT
HOLDER FOLEY CATH W/STRAP (MISCELLANEOUS) ×1 IMPLANT
KIT TURNOVER KIT A (KITS) ×1 IMPLANT
LINER ACET PNNCL PLUS4 NEUTRAL (Hips) IMPLANT
PACK ANTERIOR HIP CUSTOM (KITS) ×1 IMPLANT
SET HNDPC FAN SPRY TIP SCT (DISPOSABLE) ×1 IMPLANT
STAPLER SKIN PROX 35W (STAPLE) IMPLANT
STRIP CLOSURE SKIN 1/2X4 (GAUZE/BANDAGES/DRESSINGS) IMPLANT
SUT ETHIBOND NAB CT1 #1 30IN (SUTURE) ×1 IMPLANT
SUT ETHILON 2 0 PS N (SUTURE) IMPLANT
SUT MNCRL AB 4-0 PS2 18 (SUTURE) IMPLANT
SUT VIC AB 0 CT1 36 (SUTURE) ×1 IMPLANT
SUT VIC AB 1 CT1 36 (SUTURE) ×1 IMPLANT
SUT VIC AB 2-0 CT1 TAPERPNT 27 (SUTURE) ×2 IMPLANT
TRAY FOLEY MTR SLVR 14FR STAT (SET/KITS/TRAYS/PACK) IMPLANT
TRAY FOLEY MTR SLVR 16FR STAT (SET/KITS/TRAYS/PACK) ×1 IMPLANT
YANKAUER SUCT BULB TIP NO VENT (SUCTIONS) ×1 IMPLANT

## 2024-04-14 NOTE — Transfer of Care (Signed)
 Immediate Anesthesia Transfer of Care Note  Patient: Natalie [name]  Procedure(s) Performed: ARTHROPLASTY, HIP, TOTAL, ANTERIOR APPROACH (Left: Hip)  Patient Location: PACU  Anesthesia Type:MAC and Spinal  Level of Consciousness: awake, alert , and oriented  Airway & Oxygen Therapy: Patient Spontanous Breathing and Patient connected to nasal cannula oxygen  Post-op Assessment: Report given to RN and Post -op Vital signs reviewed and stable  Post vital signs: Reviewed and stable  Last Vitals:  Vitals Value Taken Time  BP 94/62 04/14/24 08:45  Temp 36.5 C 04/14/24 08:41  Pulse 61 04/14/24 08:47  Resp 15 04/14/24 08:47  SpO2 95 % 04/14/24 08:47  Vitals shown include unfiled device data.  Last Pain:  Vitals:   04/14/24 0841  TempSrc:   PainSc: Asleep         Complications: No notable events documented.

## 2024-04-14 NOTE — Interval H&P Note (Signed)
 History and Physical Interval Note: The patient understands that she is here today for a left total hip replacement to treat her left hip pain and arthritis.  There has been no acute or interval change in her medical status.  The risks and benefits of surgery have been described in detail and informed consent has been obtained.  The left operative hip has been marked.  04/14/2024 7:03 AM  Natalie [name]  has presented today for surgery, with the diagnosis of osteoarthritis left hip.  The various methods of treatment have been discussed with the patient and family. After consideration of risks, benefits and other options for treatment, the patient has consented to  Procedure(s): ARTHROPLASTY, HIP, TOTAL, ANTERIOR APPROACH (Left) as a surgical intervention.  The patient's history has been reviewed, patient examined, no change in status, stable for surgery.  I have reviewed the patient's chart and labs.  Questions were answered to the patient's satisfaction.     Natalie [name]

## 2024-04-14 NOTE — Op Note (Signed)
 Operative Note  Date of operation: 04/14/2024 Preoperative diagnosis: Left hip primary osteoarthritis Postoperative diagnosis: Same  Procedure: Left direct anterior total hip arthroplasty  Implants: Implant Name Type Inv. Item Serial No. Manufacturer Lot No. LRB No. Used Action  CUP SECTOR GRIPTON - [id number] Cup CUP SECTOR GRIPTON  DEPU[employer]TH[employer] [id number] Left 1 Implanted  LINER ACET PNNCL PLUS4 NEUTRAL - [id number] Hips LINER ACET PNNCL PLUS4 NEUTRAL  DEPUY ORTHOPAEDICS [id number] Left 1 Implanted  FEMORAL STEM 12/14 TPR SZ4 HIP - [id number] Orthopedic Implant FEMORAL STEM 12/14 TPR SZ4 HIP  DEPUY ORTHOPAEDICS [id number] Left 1 Implanted  HEAD FEMORAL 32 CERAMIC - [id number] Hips HEAD FEMORAL 32 CERAMIC  DEPUY ORTHOPAEDICS [id number] Left 1 Implanted   Surgeon: Lonni GRADE. Vernetta, MD Assistant: Tory Gaskins, PA-C  Anesthesia: Spinal Antibiotics: IV Ancef EBL: 150 cc Complications: None  Indications: The patient is an active 63 year old female who unfortunately has developed significant arthritis involving her left hip.  Her pain is daily and it is detriment affecting her mobility, her quality of life and her actives daily living.  Her left hip x-rays show bone-on-bone wear of the left hip with complete loss of the joint space and osteophytes.  She has tried and failed conservative treatment for over a year now.  She does wish to proceed with a hip replacement we agree with this as well.  We did discuss in length and detail the risks of acute blood loss anemia, nerve and vessel injury, fracture, infection, DVT, dislocation, implant failure, leg length differences and wound healing issues.  She understands that our goals are hopefully decreased pain, improved mobility and improved quality of life.  Procedure description: After informed consent was obtained and the appropriate left hip was marked, the patient was brought to the operating room and set up on the stretcher where  spinal anesthesia was obtained.  She is then laid in supine position and traction boots were placed on both her feet.  Next she was placed supine on the Hana fracture table with a perineal post and placed in both legs and in line skeletal traction devices but no traction applied.  The left operative hip and pelvis were assessed radiographically.  The left hip was prepped and draped with DuraPrep and sterile drapes.  A timeout was called and she was identified as the correct patient to correct the left hip.  An incision was then made just inferior and posterior to the ASIS and carried slightly obliquely down the leg.  Dissection was carried down to the tensor fascia lata muscle and the tensor fascia was then divided longitudinally to proceed with a direct and traverse the hip.  Circumflex vessels were identified and cauterized.  The hip capsule was identified and opened up in L-type format.  Cobra retractors were placed around the medial and lateral femoral neck and a femoral neck cut was made with an oscillating saw just proximal to the lesser trochanter and this cut was completed with an osteotome.  A corkscrew was placed in the femoral head and the femoral head was removed in its entirety and there was a wide area of cartilage wear.  A bent Hohmann was then placed over the medial acetabular rim and remanence of the acetabular labrum and other debris removed.  Reaming was then initiated from a size 43 reamer and stepwise increments going up to a size 49 reamer with all reamers placed under direct visualization and the last reamer placed under direct fluoroscopy in order to obtain the  depth and reaming, the inclination and the anteversion.  The real DePuy sector GRIPTION acetabular opponent size 50 was then placed without difficulty followed by 32+4 polythene liner.  Attention was then turned the femur.  With the left leg externally rotated to 120 degrees, extended and adducted, a Mueller retractor was placed  medially and a Hohmann retractor behind the greater trochanter.  The lateral joint capsule was released and a box cutting osteotome was used to enter the femoral canal.  Broaching was then initiated using the Actis broaching system from a size 0 going to a size 4.  Based on her radiographic and clinical anatomy we trialed a high offset solution.  The joint capsule was closed with interrupted #1 Ethibond suture followed by #1 Vicryl close the tensor fascia.  0 Vicryl was used to close the deep tissue and 2-0 Vicryl was used to close subcutaneous tissue.  The skin was closed with staples.  An Aquacel dressing was applied.  The patient was taken off of the Hana table and taken the recovery room.  Tory Gaskins, PA-C did assist during the entire case from beginning to end and his assistance was crucial and medically necessary for soft tissue management and retraction, helping guide implant placement and a layered closure of the wound.

## 2024-04-14 NOTE — Anesthesia Preprocedure Evaluation (Signed)
 Anesthesia Evaluation  Patient identified by MRN, date of birth, ID band Patient awake    Reviewed: Allergy & Precautions, NPO status , Patient's Chart, lab work & pertinent test results, reviewed documented beta blocker date and time   History of Anesthesia Complications (+) Family history of anesthesia reactionNegative for: history of anesthetic complications  Airway Mallampati: II  TM Distance: >3 FB     Dental no notable dental hx.    Pulmonary neg COPD, neg recent URI, former smoker   breath sounds clear to auscultation       Cardiovascular Exercise Tolerance: Good (-) hypertension(-) angina (-) CAD and (-) Past MI  Rhythm:Regular Rate:Normal     Neuro/Psych neg Seizures PSYCHIATRIC DISORDERS Anxiety        GI/Hepatic ,neg GERD  ,,(+) neg Cirrhosis        Endo/Other    Renal/GU Renal disease     Musculoskeletal  (+) Arthritis , Osteoarthritis,    Abdominal   Peds  Hematology   Anesthesia Other Findings   Reproductive/Obstetrics                              Anesthesia Physical Anesthesia Plan  ASA: 2  Anesthesia Plan: Spinal and MAC   Post-op Pain Management:    Induction: Intravenous  PONV Risk Score and Plan: 2 and Ondansetron and Dexamethasone  Airway Management Planned: Natural Airway and Simple Face Mask  Additional Equipment:   Intra-op Plan:   Post-operative Plan:   Informed Consent: I have reviewed the patients History and Physical, chart, labs and discussed the procedure including the risks, benefits and alternatives for the proposed anesthesia with the patient or authorized representative who has indicated his/her understanding and acceptance.     Dental advisory given  Plan Discussed with: CRNA  Anesthesia Plan Comments:          Anesthesia Quick Evaluation

## 2024-04-14 NOTE — Plan of Care (Signed)
  Problem: Education: Goal: Knowledge of General Education information will improve Description: Including pain rating scale, medication(s)/side effects and non-pharmacologic comfort measures Outcome: Progressing   Problem: Health Behavior/Discharge Planning: Goal: Ability to manage health-related needs will improve Outcome: Progressing   Problem: Clinical Measurements: Goal: Ability to maintain clinical measurements within normal limits will improve Outcome: Progressing Goal: Will remain free from infection Outcome: Progressing Goal: Diagnostic test results will improve Outcome: Progressing Goal: Respiratory complications will improve Outcome: Progressing Goal: Cardiovascular complication will be avoided Outcome: Progressing   Problem: Activity: Goal: Risk for activity intolerance will decrease Outcome: Progressing   Problem: Nutrition: Goal: Adequate nutrition will be maintained Outcome: Completed/Met   Problem: Coping: Goal: Level of anxiety will decrease Outcome: Progressing   Problem: Elimination: Goal: Will not experience complications related to bowel motility Outcome: Progressing Goal: Will not experience complications related to urinary retention Outcome: Progressing   Problem: Pain Managment: Goal: General experience of comfort will improve and/or be controlled Outcome: Progressing   Problem: Safety: Goal: Ability to remain free from injury will improve Outcome: Progressing   Problem: Skin Integrity: Goal: Risk for impaired skin integrity will decrease Outcome: Progressing   Problem: Education: Goal: Knowledge of the prescribed therapeutic regimen will improve Outcome: Progressing Goal: Understanding of discharge needs will improve Outcome: Progressing Goal: Individualized Educational Video(s) Outcome: Completed/Met   Problem: Activity: Goal: Ability to avoid complications of mobility impairment will improve Outcome: Progressing Goal: Ability to  tolerate increased activity will improve Outcome: Progressing   Problem: Clinical Measurements: Goal: Postoperative complications will be avoided or minimized Outcome: Progressing   Problem: Pain Management: Goal: Pain level will decrease with appropriate interventions Outcome: Progressing   Problem: Skin Integrity: Goal: Will show signs of wound healing Outcome: Progressing

## 2024-04-14 NOTE — Anesthesia Procedure Notes (Signed)
 Procedure Name: MAC Date/Time: 04/14/2024 7:22 AM  Performed by: Obadiah Reyes BROCKS, CRNAPre-anesthesia Checklist: Patient identified, Emergency Drugs available, Suction available, Patient being monitored and Timeout performed Patient Re-evaluated:Patient Re-evaluated prior to induction Oxygen Delivery Method: Simple face mask Preoxygenation: Pre-oxygenation with 100% oxygen Induction Type: IV induction Airway Equipment and Method: Oral airway

## 2024-04-14 NOTE — Anesthesia Procedure Notes (Signed)
 Spinal  Patient location during procedure: OR Start time: 04/14/2024 7:10 AM End time: 04/14/2024 7:15 AM Reason for block: surgical anesthesia Staffing Performed: anesthesiologist  Anesthesiologist: Keneth Lynwood POUR, MD Performed by: Keneth Lynwood POUR, MD Authorized by: Keneth Lynwood POUR, MD   Preanesthetic Checklist Completed: patient identified, IV checked, site marked, risks and benefits discussed, surgical consent, monitors and equipment checked, pre-op evaluation and timeout performed Spinal Block Patient position: sitting Prep: DuraPrep Patient monitoring: heart rate, cardiac monitor, continuous pulse ox and blood pressure Approach: midline Location: L3-4 Injection technique: single-shot Needle Needle type: Sprotte  Needle gauge: 24 G Needle length: 9 cm Assessment Sensory level: T4 Events: CSF return

## 2024-04-14 NOTE — Anesthesia Postprocedure Evaluation (Signed)
 Anesthesia Post Note  Patient: Beula Caldron Gaunce  Procedure(s) Performed: ARTHROPLASTY, HIP, TOTAL, ANTERIOR APPROACH (Left: Hip)     Patient location during evaluation: PACU Anesthesia Type: MAC Level of consciousness: awake and alert Pain management: pain level controlled Vital Signs Assessment: post-procedure vital signs reviewed and stable Respiratory status: spontaneous breathing, nonlabored ventilation, respiratory function stable and patient connected to nasal cannula oxygen Cardiovascular status: blood pressure returned to baseline and stable Postop Assessment: no apparent nausea or vomiting Anesthetic complications: no   No notable events documented.  Last Vitals:  Vitals:   04/14/24 1030 04/14/24 1055  BP: 96/67 108/72  Pulse: 62 69  Resp: 17 14  Temp:  36.4 C  SpO2: 98% 100%    Last Pain:  Vitals:   04/14/24 1143  TempSrc:   PainSc: 4                  Lynwood MARLA Cornea

## 2024-04-14 NOTE — Evaluation (Signed)
 Physical Therapy Evaluation Patient Details Name: Natalie [name] MRN: [id number] DOB: 1960-12-29 Today's Date: 04/14/2024  History of Present Illness  Pt is a 64 year old female s/p L THA on 04/14/24  Clinical Impression  Pt is s/p THA resulting in the deficits listed below (see PT Problem List).  Pt will benefit from acute skilled PT to increase their independence and safety with mobility to facilitate discharge.   Pt reports being very active prior to surgery and eager to be OOB.  Pt ambulated 80 ft in hallway with RW.  Pt anticipates return home with spouse and HHPT upon d/c.  Pt requesting RW and BSC.           If plan is discharge home, recommend the following:     Can travel by private vehicle        Equipment Recommendations Rolling walker (2 wheels);BSC/3in1  Recommendations for Other Services       Functional Status Assessment Patient has had a recent decline in their functional status and demonstrates the ability to make significant improvements in function in a reasonable and predictable amount of time.     Precautions / Restrictions Precautions Precautions: Fall Restrictions Weight Bearing Restrictions Per Provider Order: No Other Position/Activity Restrictions: WBAT      Mobility  Bed Mobility Overal bed mobility: Needs Assistance Bed Mobility: Supine to Sit     Supine to sit: Min assist     General bed mobility comments: verbal cues for technique, assist for L LE due to pain    Transfers Overall transfer level: Needs assistance Equipment used: Rolling walker (2 wheels) Transfers: Sit to/from Stand Sit to Stand: Contact guard assist           General transfer comment: verbal cues for UE and LE positioning    Ambulation/Gait Ambulation/Gait assistance: Contact guard assist Gait Distance (Feet): 80 Feet Assistive device: Rolling walker (2 wheels) Gait Pattern/deviations: Step-to pattern, Decreased stance time - left, Antalgic Gait  velocity: decr     General Gait Details: verbal cues for sequence, RW positioning, posture, step length  Stairs            Wheelchair Mobility     Tilt Bed    Modified Rankin (Stroke Patients Only)       Balance                                             Pertinent Vitals/Pain Pain Assessment Pain Assessment: 0-10 Pain Score: 4  Pain Location: left hip Pain Descriptors / Indicators: Sore, Aching Pain Intervention(s): Premedicated before session, Monitored during session, Repositioned    Home Living Family/patient expects to be discharged to:: Private residence Living Arrangements: Spouse/significant other   Type of Home: House Home Access: Stairs to enter Entrance Stairs-Rails: Right Entrance Stairs-Number of Steps: 4   Home Layout: Able to live on main level with bedroom/bathroom Home Equipment: None      Prior Function Prior Level of Function : Independent/Modified Independent                     Extremity/Trunk Assessment        Lower Extremity Assessment Lower Extremity Assessment: LLE deficits/detail LLE Deficits / Details: anticipated post op hip weakness observed, pt requiring assist for L LE movement in bed, able to perform ankle pumps  Communication   Communication Communication: No apparent difficulties    Cognition Arousal: Alert Behavior During Therapy: WFL for tasks assessed/performed   PT - Cognitive impairments: No apparent impairments                         Following commands: Intact       Cueing       General Comments      Exercises     Assessment/Plan    PT Assessment Patient needs continued PT services  PT Problem List Decreased strength;Decreased activity tolerance;Decreased balance;Decreased mobility;Decreased knowledge of use of DME;Pain       PT Treatment Interventions Stair training;Gait training;DME instruction;Therapeutic activities;Therapeutic  exercise;Functional mobility training;Patient/family education    PT Goals (Current goals can be found in the Care Plan section)  Acute Rehab PT Goals PT Goal Formulation: With patient Time For Goal Achievement: 04/21/24 Potential to Achieve Goals: Good    Frequency 7X/week     Co-evaluation               AM-PAC PT 6 Clicks Mobility  Outcome Measure Help needed turning from your back to your side while in a flat bed without using bedrails?: A Little Help needed moving from lying on your back to sitting on the side of a flat bed without using bedrails?: A Little Help needed moving to and from a bed to a chair (including a wheelchair)?: A Little Help needed standing up from a chair using your arms (e.g., wheelchair or bedside chair)?: A Little Help needed to walk in hospital room?: A Little Help needed climbing 3-5 steps with a railing? : A Little 6 Click Score: 18    End of Session Equipment Utilized During Treatment: Gait belt Activity Tolerance: Patient tolerated treatment well Patient left: in chair;with call bell/phone within reach Nurse Communication: Mobility status PT Visit Diagnosis: Difficulty in walking, not elsewhere classified (R26.2)    Time: 1435-1500 PT Time Calculation (min) (ACUTE ONLY): 25 min   Charges:   PT Evaluation $PT Eval Low Complexity: 1 Low PT Treatments $Gait Training: 8-22 mins PT General Charges $$ ACUTE PT VISIT: 1 Visit        Tari KLEIN, DPT Physical Therapist Acute Rehabilitation Services Office: [phone]   [name] 04/14/2024, 3:50 PM

## 2024-04-14 NOTE — TOC Transition Note (Signed)
 Transition of Care [facility]) - Discharge Note   Patient Details  Name: Natalie [name] MRN: [id number] Date of Birth: May 16, 1961  Transition of [facility] CM/SW Contact:  NORMAN ASPEN, LCSW Phone Number: 04/14/2024, 3:53 PM   Clinical Narrative:     Met with pt who confirms need for RW and would, also, like a bedside commode.  Discussed that bsc is not a covered item under insurance and she will purchase this on her own.  No DME agency preference for RW.  Order placed with [employer] for delivery to room prior to dc.  Pt aware that HHPT was prearranged with [facility] via ortho MD office prior to surgery.  No further IP CM needs.  Final next level of care: Home w Home Health Services Barriers to Discharge: No Barriers Identified   Patient Goals and CMS Choice Patient states their goals for this hospitalization and ongoing recovery are:: return home          Discharge Placement                       Discharge Plan and Services Additional resources added to the After Visit Summary for                  DME Arranged: Walker rolling DME Agency: [employer] Date DME Agency Contacted: 04/14/24 Time DME Agency Contacted: [id number] Representative spoke with at DME Agency: Darlyn HH Arranged: PT HH Agency: [facility]        Social Drivers of Health (SDOH) Interventions SDOH Screenings   Food Insecurity: No Food Insecurity (04/14/2024)  Housing: Low Risk  (04/14/2024)  Transportation Needs: No Transportation Needs (04/14/2024)  Utilities: Not At Risk (04/14/2024)  Depression (PHQ2-9): Low Risk  (03/02/2024)  Tobacco Use: Medium Risk (04/14/2024)     Readmission Risk Interventions     No data to display

## 2024-04-15 DIAGNOSIS — E559 Vitamin D deficiency, unspecified: Secondary | ICD-10-CM | POA: Diagnosis present

## 2024-04-15 DIAGNOSIS — Z96642 Presence of left artificial hip joint: Secondary | ICD-10-CM | POA: Diagnosis not present

## 2024-04-15 DIAGNOSIS — F419 Anxiety disorder, unspecified: Secondary | ICD-10-CM | POA: Diagnosis present

## 2024-04-15 DIAGNOSIS — Z9181 History of falling: Secondary | ICD-10-CM | POA: Diagnosis not present

## 2024-04-15 DIAGNOSIS — M8580 Other specified disorders of bone density and structure, unspecified site: Secondary | ICD-10-CM | POA: Diagnosis present

## 2024-04-15 DIAGNOSIS — Z825 Family history of asthma and other chronic lower respiratory diseases: Secondary | ICD-10-CM | POA: Diagnosis not present

## 2024-04-15 DIAGNOSIS — M419 Scoliosis, unspecified: Secondary | ICD-10-CM | POA: Diagnosis present

## 2024-04-15 DIAGNOSIS — Z8249 Family history of ischemic heart disease and other diseases of the circulatory system: Secondary | ICD-10-CM | POA: Diagnosis not present

## 2024-04-15 DIAGNOSIS — Z888 Allergy status to other drugs, medicaments and biological substances status: Secondary | ICD-10-CM | POA: Diagnosis not present

## 2024-04-15 DIAGNOSIS — Z823 Family history of stroke: Secondary | ICD-10-CM | POA: Diagnosis not present

## 2024-04-15 DIAGNOSIS — Z8262 Family history of osteoporosis: Secondary | ICD-10-CM | POA: Diagnosis not present

## 2024-04-15 DIAGNOSIS — Z87891 Personal history of nicotine dependence: Secondary | ICD-10-CM | POA: Diagnosis not present

## 2024-04-15 DIAGNOSIS — M1612 Unilateral primary osteoarthritis, left hip: Secondary | ICD-10-CM | POA: Diagnosis present

## 2024-04-15 DIAGNOSIS — Z8261 Family history of arthritis: Secondary | ICD-10-CM | POA: Diagnosis not present

## 2024-04-15 LAB — BASIC METABOLIC PANEL WITH GFR
Anion gap: 10 (ref 5–15)
BUN: 17 mg/dL (ref 8–23)
CO2: 25 mmol/L (ref 22–32)
Calcium: 9.2 mg/dL (ref 8.9–10.3)
Chloride: 103 mmol/L (ref 98–111)
Creatinine, Ser: 0.63 mg/dL (ref 0.44–1.00)
GFR, Estimated: >60 mL/min (ref >60)
Glucose, Bld: 128 mg/dL — ABNORMAL HIGH (ref 70–99)
Potassium: 4.2 mmol/L (ref 3.5–5.1)
Sodium: 138 mmol/L (ref 135–145)

## 2024-04-15 LAB — CBC
HCT: 39 % (ref 36.0–46.0)
Hemoglobin: 12.1 g/dL (ref 12.0–15.0)
MCH: 29.4 pg (ref 26.0–34.0)
MCHC: 31 g/dL (ref 30.0–36.0)
MCV: 94.7 fL (ref 80.0–100.0)
Platelets: 268 K/uL (ref 150–400)
RBC: 4.12 MIL/uL (ref 3.87–5.11)
RDW: 12.9 % (ref 11.5–15.5)
WBC: 10.5 K/uL (ref 4.0–10.5)
nRBC: 0 % (ref 0.0–0.2)

## 2024-04-15 MED ORDER — OXYCODONE HCL 5 MG PO TABS: 5.0000 mg | ORAL_TABLET | Freq: Four times a day (QID) | ORAL | 0 refills | Status: DC | PRN

## 2024-04-15 MED ORDER — ONDANSETRON 4 MG PO TBDP: 4.0000 mg | ORAL_TABLET | Freq: Three times a day (TID) | ORAL | 0 refills | Status: AC | PRN

## 2024-04-15 MED ORDER — SODIUM CHLORIDE 0.9 % IV BOLUS
500.0000 mL | Freq: Once | INTRAVENOUS | Status: AC
  Administered 2024-04-15: 500 mL via INTRAVENOUS

## 2024-04-15 MED ORDER — METHOCARBAMOL 500 MG PO TABS: 500.0000 mg | ORAL_TABLET | Freq: Four times a day (QID) | ORAL | 0 refills | Status: DC | PRN

## 2024-04-15 MED ORDER — ASPIRIN 81 MG PO CHEW: 81.0000 mg | CHEWABLE_TABLET | Freq: Two times a day (BID) | ORAL | 0 refills | Status: AC

## 2024-04-15 NOTE — Discharge Instructions (Signed)
 Per [facility] clinic policy, our goal is ensure optimal postoperative pain control with a multimodal pain management strategy. For all [facility] patients, our goal is to wean post-operative narcotic medications by 6 weeks post-operatively. If this is not possible due to utilization of pain medication prior to surgery, your [facility] doctor will support your acute post-operative pain control for the first 6 weeks postoperatively, with a plan to transition you back to your primary pain team following that. Natalie [name] will work to ensure a Therapist, occupational.  INSTRUCTIONS AFTER JOINT REPLACEMENT   Remove items at home which could result in a fall. This includes throw rugs or furniture in walking pathways ICE to the affected joint every three hours while awake for 30 minutes at a time, for at least the first 3-5 days, and then as needed for pain and swelling.  Continue to use ice for pain and swelling. You may notice swelling that will progress down to the foot and ankle.  This is normal after surgery.  Elevate your leg when you are not up walking on it.   Continue to use the breathing machine you got in the hospital (incentive spirometer) which will help keep your temperature down.  It is common for your temperature to cycle up and down following surgery, especially at night when you are not up moving around and exerting yourself.  The breathing machine keeps your lungs expanded and your temperature down.   DIET:  As you were doing prior to hospitalization, we recommend a well-balanced diet.  DRESSING / WOUND CARE / SHOWERING  Keep the surgical dressing until follow up.  The dressing is water  proof, so you can shower without any extra covering.  IF THE DRESSING FALLS OFF or the wound gets wet inside, change the dressing with sterile gauze.  Please use good hand washing techniques before changing the dressing.  Do not use any lotions or creams on the incision until instructed by your surgeon.     ACTIVITY  Increase activity slowly as tolerated, but follow the weight bearing instructions below.   No driving for 6 weeks or until further direction given by your physician.  You cannot drive while taking narcotics.  No lifting or carrying greater than 10 lbs. until further directed by your surgeon. Avoid periods of inactivity such as sitting longer than an hour when not asleep. This helps prevent blood clots.  You may return to work once you are authorized by your doctor.     WEIGHT BEARING   Weight bearing as tolerated with assist device (walker, cane, etc) as directed, use it as long as suggested by your surgeon or therapist, typically at least 4-6 weeks.   EXERCISES  Results after joint replacement surgery are often greatly improved when you follow the exercise, range of motion and muscle strengthening exercises prescribed by your doctor. Safety measures are also important to protect the joint from further injury. Any time any of these exercises cause you to have increased pain or swelling, decrease what you are doing until you are comfortable again and then slowly increase them. If you have problems or questions, call your caregiver or physical therapist for advice.   Rehabilitation is important following a joint replacement. After just a few days of immobilization, the muscles of the leg can become weakened and shrink (atrophy).  These exercises are designed to build up the tone and strength of the thigh and leg muscles and to improve motion. Often times heat used for twenty to thirty minutes before  working out will loosen up your tissues and help with improving the range of motion but do not use heat for the first two weeks following surgery (sometimes heat can increase post-operative swelling).   These exercises can be done on a training (exercise) mat, on the floor, on a table or on a bed. Use whatever works the best and is most comfortable for you.    Use music or television  while you are exercising so that the exercises are a pleasant break in your day. This will make your life better with the exercises acting as a break in your routine that you can look forward to.   Perform all exercises about fifteen times, three times per day or as directed.  You should exercise both the operative leg and the other leg as well.  Exercises include:   Quad Sets - Tighten up the muscle on the front of the thigh (Quad) and hold for 5-10 seconds.   Straight Leg Raises - With your knee straight (if you were given a brace, keep it on), lift the leg to 60 degrees, hold for 3 seconds, and slowly lower the leg.  Perform this exercise against resistance later as your leg gets stronger.  Leg Slides: Lying on your back, slowly slide your foot toward your buttocks, bending your knee up off the floor (only go as far as is comfortable). Then slowly slide your foot back down until your leg is flat on the floor again.  Angel Wings: Lying on your back spread your legs to the side as far apart as you can without causing discomfort.  Hamstring Strength:  Lying on your back, push your heel against the floor with your leg straight by tightening up the muscles of your buttocks.  Repeat, but this time bend your knee to a comfortable angle, and push your heel against the floor.  You may put a pillow under the heel to make it more comfortable if necessary.   A rehabilitation program following joint replacement surgery can speed recovery and prevent re-injury in the future due to weakened muscles. Contact your doctor or a physical therapist for more information on knee rehabilitation.    CONSTIPATION  Constipation is defined medically as fewer than three stools per week and severe constipation as less than one stool per week.  Even if you have a regular bowel pattern at home, your normal regimen is likely to be disrupted due to multiple reasons following surgery.  Combination of anesthesia, postoperative  narcotics, change in appetite and fluid intake all can affect your bowels.   YOU MUST use at least one of the following options; they are listed in order of increasing strength to get the job done.  They are all available over the counter, and you may need to use some, POSSIBLY even all of these options:    Drink plenty of fluids (prune juice may be helpful) and high fiber foods Colace 100 mg by mouth twice a day  Senokot for constipation as directed and as needed Dulcolax (bisacodyl), take with full glass of water  Miralax (polyethylene glycol) once or twice a day as needed.  If you have tried all these things and are unable to have a bowel movement in the first 3-4 days after surgery call either your surgeon or your primary doctor.    If you experience loose stools or diarrhea, hold the medications until you stool forms back up.  If your symptoms do not get better within 1 week  or if they get worse, check with your doctor.  If you experience the worst abdominal pain ever or develop nausea or vomiting, please contact the office immediately for further recommendations for treatment.   ITCHING:  If you experience itching with your medications, try taking only a single pain pill, or even half a pain pill at a time.  You can also use Benadryl  over the counter for itching or also to help with sleep.   TED HOSE STOCKINGS:  Use stockings on both legs until for at least 2 weeks or as directed by physician office. They may be removed at night for sleeping.  MEDICATIONS:  See your medication summary on the "After Visit Summary" that nursing will review with you.  You may have some home medications which will be placed on hold until you complete the course of blood thinner medication.  It is important for you to complete the blood thinner medication as prescribed.  PRECAUTIONS:  If you experience chest pain or shortness of breath - call 911 immediately for transfer to the hospital emergency department.    If you develop a fever greater that 101 F, purulent drainage from wound, increased redness or drainage from wound, foul odor from the wound/dressing, or calf pain - CONTACT YOUR SURGEON.                                                   FOLLOW-UP APPOINTMENTS:  If you do not already have a post-op appointment, please call the office for an appointment to be seen by your surgeon.  Guidelines for how soon to be seen are listed in your "After Visit Summary", but are typically between 1-4 weeks after surgery.  OTHER INSTRUCTIONS:   Knee Replacement:  Do not place pillow under knee, focus on keeping the knee straight while resting. CPM instructions: 0-90 degrees, 2 hours in the morning, 2 hours in the afternoon, and 2 hours in the evening. Place foam block, curve side up under heel at all times except when in CPM or when walking.  DO NOT modify, tear, cut, or change the foam block in any way.  POST-OPERATIVE OPIOID TAPER INSTRUCTIONS: It is important to wean off of your opioid medication as soon as possible. If you do not need pain medication after your surgery it is ok to stop day one. Opioids include: Codeine, Hydrocodone(Norco, Vicodin), Oxycodone (Percocet, oxycontin ) and hydromorphone  amongst others.  Long term and even short term use of opiods can cause: Increased pain response Dependence Constipation Depression Respiratory depression And more.  Withdrawal symptoms can include Flu like symptoms Nausea, vomiting And more Techniques to manage these symptoms Hydrate well Eat regular healthy meals Stay active Use relaxation techniques(deep breathing, meditating, yoga) Do Not substitute Alcohol to help with tapering If you have been on opioids for less than two weeks and do not have pain than it is ok to stop all together.  Plan to wean off of opioids This plan should start within one week post op of your joint replacement. Maintain the same interval or time between taking each dose  and first decrease the dose.  Cut the total daily intake of opioids by one tablet each day Next start to increase the time between doses. The last dose that should be eliminated is the evening dose.   MAKE SURE YOU:  Understand these instructions.  Get help right away if you are not doing well or get worse.    Thank you for letting us  be a part of your medical care team.  It is a privilege we respect greatly.  We hope these instructions will help you stay on track for a fast and full recovery!     Dental Antibiotics:  In most cases prophylactic antibiotics for Dental procdeures after total joint surgery are not necessary.  Exceptions are as follows:  1. History of prior total joint infection  2. Severely immunocompromised (Organ Transplant, cancer chemotherapy, Rheumatoid biologic meds such as Humera)  3. Poorly controlled diabetes (A1C &gt; 8.0, blood glucose over 200)  If you have one of these conditions, contact your surgeon for an antibiotic prescription, prior to your dental procedure.

## 2024-04-15 NOTE — Progress Notes (Signed)
 Physical Therapy Treatment Patient Details Name: Natalie [name] MRN: [id number] DOB: 12/02/1960 Today's Date: 04/15/2024   History of Present Illness Pt is a 63 year old female s/p L THA on 04/14/24    PT Comments  Pt ambulated in hallway and performed standing exercises.  Upon sitting in recliner, pt reported increased nausea and looking more pale.  Provided with cold wash clothe and vitals obtained (documented by RN).  Pt encouraged to perform ankle pumps and provided with ginger ale per request.  04/15/24 1018  Vital Signs  Pulse Rate 76  BP (!) 96/49  BP Location Left Arm  BP Method Automatic  Patient Position (if appropriate) Lying      If plan is discharge home, recommend the following:     Can travel by private vehicle        Equipment Recommendations  Rolling walker (2 wheels);BSC/3in1    Recommendations for Other Services       Precautions / Restrictions Precautions Precautions: Fall Restrictions Other Position/Activity Restrictions: WBAT     Mobility  Bed Mobility Overal bed mobility: Needs Assistance Bed Mobility: Supine to Sit     Supine to sit: Contact guard, HOB elevated     General bed mobility comments: verbal cues for technique and self assist    Transfers Overall transfer level: Needs assistance Equipment used: Rolling walker (2 wheels) Transfers: Sit to/from Stand Sit to Stand: Contact guard assist           General transfer comment: verbal cues for UE and LE positioning    Ambulation/Gait Ambulation/Gait assistance: Contact guard assist Gait Distance (Feet): 120 Feet Assistive device: Rolling walker (2 wheels) Gait Pattern/deviations: Step-to pattern, Decreased stance time - left, Antalgic Gait velocity: decr     General Gait Details: verbal cues for sequence, RW positioning, posture, step length   Stairs             Wheelchair Mobility     Tilt Bed    Modified Rankin (Stroke Patients Only)        Balance                                            Communication Communication Communication: No apparent difficulties  Cognition Arousal: Alert Behavior During Therapy: WFL for tasks assessed/performed   PT - Cognitive impairments: No apparent impairments                         Following commands: Intact      Cueing    Exercises Total Joint Exercises Hip ABduction/ADduction: AROM, Standing, Left, 10 reps Knee Flexion: AROM, Left, 10 reps, Standing Marching in Standing: AROM, Left, 10 reps, Standing Standing Hip Extension: AROM, Left, Standing, 10 reps    General Comments        Pertinent Vitals/Pain Pain Assessment Pain Assessment: 0-10 Pain Score: 4  Pain Location: left hip Pain Descriptors / Indicators: Sore, Aching Pain Intervention(s): Monitored during session, Repositioned, Ice applied, Premedicated before session    Home Living                          Prior Function            PT Goals (current goals can now be found in the care plan section) Progress towards PT goals: Progressing toward goals  Frequency    7X/week      PT Plan      Co-evaluation              AM-PAC PT 6 Clicks Mobility   Outcome Measure  Help needed turning from your back to your side while in a flat bed without using bedrails?: A Little Help needed moving from lying on your back to sitting on the side of a flat bed without using bedrails?: A Little Help needed moving to and from a bed to a chair (including a wheelchair)?: A Little Help needed standing up from a chair using your arms (e.g., wheelchair or bedside chair)?: A Little Help needed to walk in hospital room?: A Little Help needed climbing 3-5 steps with a railing? : A Little 6 Click Score: 18    End of Session Equipment Utilized During Treatment: Gait belt Activity Tolerance: Patient tolerated treatment well Patient left: in chair;with call bell/phone within  reach Nurse Communication: Mobility status PT Visit Diagnosis: Difficulty in walking, not elsewhere classified (R26.2)     Time: 9046-8977 PT Time Calculation (min) (ACUTE ONLY): 29 min  Charges:    $Gait Training: 8-22 mins $Therapeutic Exercise: 8-22 mins PT General Charges $$ ACUTE PT VISIT: 1 Visit                     Tari KLEIN, DPT Physical Therapist Acute Rehabilitation Services Office: [phone]    [name] 04/15/2024, 12:34 PM

## 2024-04-15 NOTE — Progress Notes (Signed)
 Subjective: 1 Day Post-Op Procedure(s) (LRB): ARTHROPLASTY, HIP, TOTAL, ANTERIOR APPROACH (Left) Patient reports pain as moderate.  Some nausea this am when up as well as orthostatic.  Hgb with only small drop post-op.  Objective: Vital signs in last 24 hours: Temp:  [97.6 F (36.4 C)-99.5 F (37.5 C)] 99.5 F (37.5 C) (10/04 0931) Pulse Rate:  [69-83] 76 (10/04 1018) Resp:  [14-20] 16 (10/04 0931) BP: (92-113)/(49-83) 96/49 (10/04 1018) SpO2:  [98 %-100 %] 99 % (10/04 0931)  Intake/Output from previous day: 10/03 0701 - 10/04 0700 In: 3615.4 [P.O.:720; I.V.:2795.4; IV Piggyback:100] Out: 4725 [Urine:4575; Blood:150] Intake/Output this shift: Total I/O In: 492.5 [P.O.:240; I.V.:252.5] Out: -   Recent Labs    04/15/24 0332  HGB 12.1   Recent Labs    04/15/24 0332  WBC 10.5  RBC 4.12  HCT 39.0  PLT 268   Recent Labs    04/15/24 0332  NA 138  K 4.2  CL 103  CO2 25  BUN 17  CREATININE 0.63  GLUCOSE 128*  CALCIUM 9.2   No results for input(s): LABPT, INR in the last 72 hours.  Sensation intact distally Intact pulses distally Dorsiflexion/Plantar flexion intact Incision: scant drainage   Assessment/Plan: 1 Day Post-Op Procedure(s) (LRB): ARTHROPLASTY, HIP, TOTAL, ANTERIOR APPROACH (Left) Up with therapy Plan for discharge tomorrow Discharge home with home health  Will give fluid bolus this am and encourage hydration.    Lonni CINDERELLA Poli 04/15/2024, 10:38 AM

## 2024-04-15 NOTE — Progress Notes (Signed)
 Physical Therapy Treatment Patient Details Name: Natalie [name] MRN: [id number] DOB: Feb 13, 1961 Today's Date: 04/15/2024   History of Present Illness Pt is a 63 year old female s/p L THA on 04/14/24    PT Comments  Pt requiring increased time to perform tasks however only CGA this afternoon.  Pt assisted first to bathroom per request and then ambulated in hallway.  Pt overall reports feeling very tired and returned to bed. Pt denies dizziness and nausea this afternoon. Pt anticipates d/c home tomorrow.    If plan is discharge home, recommend the following:     Can travel by private vehicle        Equipment Recommendations  Rolling walker (2 wheels);BSC/3in1    Recommendations for Other Services       Precautions / Restrictions Precautions Precautions: Fall Restrictions Other Position/Activity Restrictions: WBAT     Mobility  Bed Mobility Overal bed mobility: Needs Assistance Bed Mobility: Sit to Supine     Supine to sit: Contact guard, HOB elevated Sit to supine: Contact guard assist, HOB elevated   General bed mobility comments: verbal cues for technique and self assist    Transfers Overall transfer level: Needs assistance Equipment used: Rolling walker (2 wheels) Transfers: Sit to/from Stand Sit to Stand: Contact guard assist           General transfer comment: verbal cues for UE and LE positioning    Ambulation/Gait Ambulation/Gait assistance: Contact guard assist Gait Distance (Feet): 180 Feet Assistive device: Rolling walker (2 wheels) Gait Pattern/deviations: Step-to pattern, Decreased stance time - left, Antalgic Gait velocity: decr     General Gait Details: verbal cues for sequence, RW positioning, posture, step length   Stairs             Wheelchair Mobility     Tilt Bed    Modified Rankin (Stroke Patients Only)       Balance                                            Communication  Communication Communication: No apparent difficulties  Cognition Arousal: Alert Behavior During Therapy: WFL for tasks assessed/performed   PT - Cognitive impairments: No apparent impairments                         Following commands: Intact      Cueing    Exercises     General Comments        Pertinent Vitals/Pain Pain Assessment Pain Assessment: 0-10 Pain Score: 5  Pain Location: left hip Pain Descriptors / Indicators: Sore, Aching Pain Intervention(s): Monitored during session, Premedicated before session, Repositioned    Home Living                          Prior Function            PT Goals (current goals can now be found in the care plan section) Progress towards PT goals: Progressing toward goals    Frequency    7X/week      PT Plan      Co-evaluation              AM-PAC PT 6 Clicks Mobility   Outcome Measure  Help needed turning from your back to your side while in a flat bed without  using bedrails?: A Little Help needed moving from lying on your back to sitting on the side of a flat bed without using bedrails?: A Little Help needed moving to and from a bed to a chair (including a wheelchair)?: A Little Help needed standing up from a chair using your arms (e.g., wheelchair or bedside chair)?: A Little Help needed to walk in hospital room?: A Little Help needed climbing 3-5 steps with a railing? : A Little 6 Click Score: 18    End of Session Equipment Utilized During Treatment: Gait belt Activity Tolerance: Patient tolerated treatment well Patient left: with call bell/phone within reach;in bed;with bed alarm set Nurse Communication: Mobility status PT Visit Diagnosis: Difficulty in walking, not elsewhere classified (R26.2)     Time: 8569-8545 PT Time Calculation (min) (ACUTE ONLY): 24 min  Charges:    $Gait Training: 23-37 mins  PT General Charges $$ ACUTE PT VISIT: 1 Visit                    Natalie KLEIN,  DPT Physical Therapist Acute Rehabilitation Services Office: [phone]    Natalie [name] 04/15/2024, 3:58 PM

## 2024-04-16 NOTE — Progress Notes (Signed)
 Physical Therapy Treatment Patient Details Name: Natalie [name] MRN: [id number] DOB: [birth year] Today's Date: 04/16/2024   History of Present Illness Pt is a 63 year old female s/p L THA on 04/14/24    PT Comments  Pt ambulated in hallway and practiced safe stair technique. Pt anticipates a second session and then d/c home later this afternoon (spouse coming around 4ish per her report).     If plan is discharge home, recommend the following:     Can travel by private vehicle        Equipment Recommendations  Rolling walker (2 wheels)    Recommendations for Other Services       Precautions / Restrictions Precautions Precautions: Fall Restrictions Other Position/Activity Restrictions: WBAT     Mobility  Bed Mobility Overal bed mobility: Needs Assistance Bed Mobility: Sit to Supine     Supine to sit: Contact guard, HOB elevated     General bed mobility comments: verbal cues for technique and self assist    Transfers Overall transfer level: Needs assistance Equipment used: Rolling walker (2 wheels) Transfers: Sit to/from Stand Sit to Stand: Contact guard assist           General transfer comment: verbal cues for UE and LE positioning    Ambulation/Gait Ambulation/Gait assistance: Contact guard assist Gait Distance (Feet): 160 Feet Assistive device: Rolling walker (2 wheels) Gait Pattern/deviations: Step-to pattern, Decreased stance time - left, Antalgic Gait velocity: decr     General Gait Details: verbal cues for sequence, RW positioning, posture, step length   Stairs Stairs: Yes Stairs assistance: Contact guard assist Stair Management: Step to pattern, Forwards, One rail Right Number of Stairs: 3 General stair comments: performed with right rail and wall on left; cues for sequence and safety; pt performed twice   Wheelchair Mobility     Tilt Bed    Modified Rankin (Stroke Patients Only)       Balance                                             Communication Communication Communication: No apparent difficulties  Cognition Arousal: Alert Behavior During Therapy: WFL for tasks assessed/performed   PT - Cognitive impairments: No apparent impairments                         Following commands: Intact      Cueing    Exercises      General Comments        Pertinent Vitals/Pain Pain Assessment Pain Assessment: 0-10 Pain Score: 2  Pain Location: left hip Pain Descriptors / Indicators: Sore, Aching Pain Intervention(s): Monitored during session, Premedicated before session, Repositioned    Home Living                          Prior Function            PT Goals (current goals can now be found in the care plan section) Progress towards PT goals: Progressing toward goals    Frequency    7X/week      PT Plan      Co-evaluation              AM-PAC PT 6 Clicks Mobility   Outcome Measure  Help needed turning from your back to your side while  in a flat bed without using bedrails?: A Little Help needed moving from lying on your back to sitting on the side of a flat bed without using bedrails?: A Little Help needed moving to and from a bed to a chair (including a wheelchair)?: A Little Help needed standing up from a chair using your arms (e.g., wheelchair or bedside chair)?: A Little Help needed to walk in hospital room?: A Little Help needed climbing 3-5 steps with a railing? : A Little 6 Click Score: 18    End of Session Equipment Utilized During Treatment: Gait belt Activity Tolerance: Patient tolerated treatment well Patient left: with call bell/phone within reach;in chair   PT Visit Diagnosis: Difficulty in walking, not elsewhere classified (R26.2)     Time: 8970-8957 PT Time Calculation (min) (ACUTE ONLY): 13 min  Charges:    $Gait Training: 8-22 mins PT General Charges $$ ACUTE PT VISIT: 1 Visit                     Tari KLEIN,  DPT Physical Therapist Acute Rehabilitation Services Office: [phone]    Kati L Payson 04/16/2024, 12:04 PM

## 2024-04-16 NOTE — Progress Notes (Signed)
 Physical Therapy Treatment Patient Details Name: Natalie [name] MRN: [id number] DOB: [birth year] Today's Date: 04/16/2024   History of Present Illness Pt is a 63 year old female s/p L THA on 04/14/24    PT Comments  Pt performed LE exercises with HEP handout and ambulated again in hallway.  Pt had no further questions and is ready for d/c home today.     If plan is discharge home, recommend the following:     Can travel by private vehicle        Equipment Recommendations  Rolling walker (2 wheels)    Recommendations for Other Services       Precautions / Restrictions Precautions Precautions: Fall Restrictions Other Position/Activity Restrictions: WBAT     Mobility  Bed Mobility Overal bed mobility: Needs Assistance Bed Mobility: Supine to Sit, Sit to Supine     Supine to sit: Supervision, HOB elevated Sit to supine: Supervision, HOB elevated   General bed mobility comments: verbal cues for technique and self assist    Transfers Overall transfer level: Needs assistance Equipment used: Rolling walker (2 wheels) Transfers: Sit to/from Stand Sit to Stand: Contact guard assist, Supervision           General transfer comment: verbal cues for UE and LE positioning    Ambulation/Gait Ambulation/Gait assistance: Contact guard assist, Supervision Gait Distance (Feet): 160 Feet Assistive device: Rolling walker (2 wheels) Gait Pattern/deviations: Step-to pattern, Decreased stance time - left, Antalgic Gait velocity: decr     General Gait Details: verbal cues for sequence, RW positioning, posture, step length   Stairs Stairs: Yes Stairs assistance: Contact guard assist Stair Management: Step to pattern, Forwards, One rail Right Number of Stairs: 3 General stair comments: performed with right rail and wall on left; cues for sequence and safety; pt performed twice   Wheelchair Mobility     Tilt Bed    Modified Rankin (Stroke Patients Only)        Balance                                            Communication Communication Communication: No apparent difficulties  Cognition Arousal: Alert Behavior During Therapy: WFL for tasks assessed/performed   PT - Cognitive impairments: No apparent impairments                         Following commands: Intact      Cueing    Exercises Total Joint Exercises Ankle Circles/Pumps: AROM, Both, 10 reps Quad Sets: AROM, Both, 10 reps Short Arc Quad: AAROM, Left, 10 reps Heel Slides: AAROM, Left, 10 reps Hip ABduction/ADduction: AROM, AAROM, Supine, Standing, Left, 10 reps Long Arc Quad: AROM, Left, Seated, 10 reps Knee Flexion: AROM, Left, 10 reps, Standing Marching in Standing: AROM, Left, 10 reps, Standing Standing Hip Extension: AROM, Left, Standing, 10 reps    General Comments        Pertinent Vitals/Pain Pain Assessment Pain Assessment: 0-10 Pain Score: 4  Pain Location: left hip Pain Descriptors / Indicators: Sore, Aching Pain Intervention(s): Monitored during session, Premedicated before session, Repositioned    Home Living                          Prior Function            PT Goals (current  goals can now be found in the care plan section) Progress towards PT goals: Progressing toward goals    Frequency    7X/week      PT Plan      Co-evaluation              AM-PAC PT 6 Clicks Mobility   Outcome Measure  Help needed turning from your back to your side while in a flat bed without using bedrails?: A Little Help needed moving from lying on your back to sitting on the side of a flat bed without using bedrails?: A Little Help needed moving to and from a bed to a chair (including a wheelchair)?: A Little Help needed standing up from a chair using your arms (e.g., wheelchair or bedside chair)?: A Little Help needed to walk in hospital room?: A Little Help needed climbing 3-5 steps with a railing? : A Little 6  Click Score: 18    End of Session Equipment Utilized During Treatment: Gait belt Activity Tolerance: Patient tolerated treatment well Patient left: in bed;with call bell/phone within reach;with bed alarm set Nurse Communication: Mobility status PT Visit Diagnosis: Difficulty in walking, not elsewhere classified (R26.2)     Time: 8666-8642 PT Time Calculation (min) (ACUTE ONLY): 24 min  Charges:    $Gait Training: 8-22 mins $Therapeutic Exercise: 8-22 mins PT General Charges $$ ACUTE PT VISIT: 1 Visit                     Tari KLEIN, DPT Physical Therapist Acute Rehabilitation Services Office: [phone]   [name] 04/16/2024, 3:15 PM

## 2024-04-16 NOTE — Plan of Care (Signed)
  Problem: Elimination: Goal: Will not experience complications related to bowel motility 04/16/2024 1443 by Feliberto Graces, RN Outcome: Adequate for Discharge 04/16/2024 1443 by Feliberto Graces, RN Outcome: Adequate for Discharge   Problem: Pain Managment: Goal: General experience of comfort will improve and/or be controlled 04/16/2024 1443 by Feliberto Graces, RN Outcome: Adequate for Discharge 04/16/2024 1443 by Feliberto Graces, RN Outcome: Adequate for Discharge   Problem: Safety: Goal: Ability to remain free from injury will improve 04/16/2024 1443 by Feliberto Graces, RN Outcome: Adequate for Discharge 04/16/2024 1443 by Feliberto Graces, RN Outcome: Adequate for Discharge   Problem: Safety: Goal: Ability to remain free from injury will improve 04/16/2024 1443 by Feliberto Graces, RN Outcome: Adequate for Discharge 04/16/2024 1443 by Feliberto Graces, RN Outcome: Adequate for Discharge   Problem: Skin Integrity: Goal: Risk for impaired skin integrity will decrease 04/16/2024 1443 by Feliberto Graces, RN Outcome: Adequate for Discharge 04/16/2024 1443 by Feliberto Graces, RN Outcome: Adequate for Discharge   Problem: Education: Goal: Knowledge of the prescribed therapeutic regimen will improve 04/16/2024 1443 by Feliberto Graces, RN Outcome: Adequate for Discharge 04/16/2024 1443 by Feliberto Graces, RN Outcome: Adequate for Discharge Goal: Understanding of discharge needs will improve 04/16/2024 1443 by Feliberto Graces, RN Outcome: Adequate for Discharge 04/16/2024 1443 by Feliberto Graces, RN Outcome: Adequate for Discharge
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
## 2024-04-16 NOTE — TOC Progression Note (Addendum)
 Transition of Care [facility]) - Progression Note    Patient Details  Name: Natalie [name] MRN: [id number] Date of Birth: 02-Apr-1961  Transition of Care [facility]) CM/SW Contact  Sonda Manuella Quill, RN Phone Number: 04/16/2024, 3:26 PM  Clinical Narrative:    Notified pt has not received RW; referral previously placed w/ [employer]; spoke w/ Ada at agency; she said agency previously tried to contact pt for required co-pay; pt notified; she will obtain info to complete transaction; will follow up w/ pt and agency.  -1538- follow up w/ pt and Ada at [employer]; co-pay has been received; TW will be delivered tp pt's room; also notified Gaetana Dadds at [facility] pt d/c today; no IP CM needs.    Barriers to Discharge: No Barriers Identified               Expected Discharge Plan and Services         Expected Discharge Date: 04/16/24               DME Arranged: Vannie rolling DME Agency: [facility] Date DME Agency Contacted: 04/14/24 Time DME Agency Contacted: [phone] Representative spoke with at DME Agency: Darlyn HH Arranged: PT HH Agency: [facility]         Social Drivers of Health (SDOH) Interventions SDOH Screenings   Food Insecurity: No Food Insecurity (04/14/2024)  Housing: Low Risk  (04/14/2024)  Transportation Needs: No Transportation Needs (04/14/2024)  Utilities: Not At Risk (04/14/2024)  Depression (PHQ2-9): Low Risk  (03/02/2024)  Tobacco Use: Medium Risk (04/14/2024)    Readmission Risk Interventions     No data to display

## 2024-04-16 NOTE — Plan of Care (Signed)
  Problem: Education: Goal: Knowledge of General Education information will improve Description: Including pain rating scale, medication(s)/side effects and non-pharmacologic comfort measures Outcome: Adequate for Discharge   Problem: Health Behavior/Discharge Planning: Goal: Ability to manage health-related needs will improve Outcome: Adequate for Discharge   Problem: Clinical Measurements: Goal: Ability to maintain clinical measurements within normal limits will improve Outcome: Progressing Goal: Will remain free from infection Outcome: Progressing Goal: Diagnostic test results will improve Outcome: Progressing Goal: Respiratory complications will improve Outcome: Adequate for Discharge Goal: Cardiovascular complication will be avoided Outcome: Progressing   Problem: Activity: Goal: Risk for activity intolerance will decrease Outcome: Adequate for Discharge   Problem: Coping: Goal: Level of anxiety will decrease Outcome: Progressing   Problem: Elimination: Goal: Will not experience complications related to bowel motility Outcome: Progressing Goal: Will not experience complications related to urinary retention Outcome: Completed/Met   Problem: Pain Managment: Goal: General experience of comfort will improve and/or be controlled Outcome: Progressing   Problem: Safety: Goal: Ability to remain free from injury will improve Outcome: Progressing   Problem: Skin Integrity: Goal: Risk for impaired skin integrity will decrease Outcome: Progressing   Problem: Education: Goal: Knowledge of the prescribed therapeutic regimen will improve Outcome: Progressing Goal: Understanding of discharge needs will improve Outcome: Progressing   Problem: Activity: Goal: Ability to avoid complications of mobility impairment will improve Outcome: Adequate for Discharge Goal: Ability to tolerate increased activity will improve Outcome: Adequate for Discharge   Problem: Clinical  Measurements: Goal: Postoperative complications will be avoided or minimized Outcome: Progressing   Problem: Pain Management: Goal: Pain level will decrease with appropriate interventions Outcome: Adequate for Discharge   Problem: Skin Integrity: Goal: Will show signs of wound healing Outcome: Progressing

## 2024-04-16 NOTE — Plan of Care (Signed)

## 2024-04-16 NOTE — Progress Notes (Signed)
 Subjective: 2 Days Post-Op Procedure(s) (LRB): ARTHROPLASTY, HIP, TOTAL, ANTERIOR APPROACH (Left) Patient reports pain as mild.  Nausea and vomiting improved.  Pain is much better controlled with the muscle relaxer  Objective: Vital signs in last 24 hours: Temp:  [97.8 F (36.6 C)-99.5 F (37.5 C)] 98 F (36.7 C) (10/05 0621) Pulse Rate:  [67-86] 86 (10/05 0621) Resp:  [16-20] 16 (10/05 0621) BP: (91-110)/(49-65) 110/65 (10/05 0621) SpO2:  [98 %-100 %] 98 % (10/05 0621)  Intake/Output from previous day: 10/04 0701 - 10/05 0700 In: 3579.1 [P.O.:1560; I.V.:1510.7; IV Piggyback:508.3] Out: 400 [Urine:400] Intake/Output this shift: No intake/output data recorded.  Recent Labs    04/15/24 0332  HGB 12.1   Recent Labs    04/15/24 0332  WBC 10.5  RBC 4.12  HCT 39.0  PLT 268   Recent Labs    04/15/24 0332  NA 138  K 4.2  CL 103  CO2 25  BUN 17  CREATININE 0.63  GLUCOSE 128*  CALCIUM 9.2   No results for input(s): LABPT, INR in the last 72 hours.  Sensation intact distally Intact pulses distally Dorsiflexion/Plantar flexion intact Incision: scant drainage   Assessment/Plan: 2 Days Post-Op Procedure(s) (LRB): ARTHROPLASTY, HIP, TOTAL, ANTERIOR APPROACH (Left) Up with physical therapy and plan for home discharge today overall patient much improved   Breeona Waid 04/16/2024, 8:48 AM

## 2024-04-17 ENCOUNTER — Encounter (HOSPITAL_COMMUNITY): Payer: Self-pay | Admitting: Orthopaedic Surgery

## 2024-04-17 ENCOUNTER — Encounter: Admitting: Physical Therapy

## 2024-04-18 ENCOUNTER — Telehealth: Payer: Self-pay | Admitting: Radiology

## 2024-04-18 DIAGNOSIS — K5900 Constipation, unspecified: Secondary | ICD-10-CM | POA: Diagnosis not present

## 2024-04-18 DIAGNOSIS — D649 Anemia, unspecified: Secondary | ICD-10-CM | POA: Diagnosis not present

## 2024-04-18 DIAGNOSIS — E559 Vitamin D deficiency, unspecified: Secondary | ICD-10-CM | POA: Diagnosis not present

## 2024-04-18 DIAGNOSIS — H5213 Myopia, bilateral: Secondary | ICD-10-CM | POA: Diagnosis not present

## 2024-04-18 DIAGNOSIS — F419 Anxiety disorder, unspecified: Secondary | ICD-10-CM | POA: Diagnosis not present

## 2024-04-18 DIAGNOSIS — M1990 Unspecified osteoarthritis, unspecified site: Secondary | ICD-10-CM | POA: Diagnosis not present

## 2024-04-18 DIAGNOSIS — Z471 Aftercare following joint replacement surgery: Secondary | ICD-10-CM | POA: Diagnosis not present

## 2024-04-18 DIAGNOSIS — M419 Scoliosis, unspecified: Secondary | ICD-10-CM | POA: Diagnosis not present

## 2024-04-18 DIAGNOSIS — J302 Other seasonal allergic rhinitis: Secondary | ICD-10-CM | POA: Diagnosis not present

## 2024-04-18 DIAGNOSIS — K219 Gastro-esophageal reflux disease without esophagitis: Secondary | ICD-10-CM | POA: Diagnosis not present

## 2024-04-18 NOTE — Telephone Encounter (Signed)
 Patient called triage. THA 04/14/24.   States she has not had a bowel movement since coming home, advised to drink Mag-citrate.   Patient was concerned about thigh swelling. She is up moving around walking, states HHPT today. Complains of numbness and discomfort, she has not taken anymore pain medicine since last night. She did not wear compression stockings last night due to feeling like they made her swelling worse.  Swelling is not below knee, only in thigh, no tightness or tenderness to lower leg.   She states she has a Rx for celebrex , she has not taken this since 30 days before surgery, she she resume the celebrex  to help with thigh swelling.   Call back # [phone]

## 2024-04-20 DIAGNOSIS — E559 Vitamin D deficiency, unspecified: Secondary | ICD-10-CM | POA: Diagnosis not present

## 2024-04-20 DIAGNOSIS — D649 Anemia, unspecified: Secondary | ICD-10-CM | POA: Diagnosis not present

## 2024-04-20 DIAGNOSIS — K5900 Constipation, unspecified: Secondary | ICD-10-CM | POA: Diagnosis not present

## 2024-04-20 DIAGNOSIS — J302 Other seasonal allergic rhinitis: Secondary | ICD-10-CM | POA: Diagnosis not present

## 2024-04-20 DIAGNOSIS — F419 Anxiety disorder, unspecified: Secondary | ICD-10-CM | POA: Diagnosis not present

## 2024-04-20 DIAGNOSIS — K219 Gastro-esophageal reflux disease without esophagitis: Secondary | ICD-10-CM | POA: Diagnosis not present

## 2024-04-20 DIAGNOSIS — H5213 Myopia, bilateral: Secondary | ICD-10-CM | POA: Diagnosis not present

## 2024-04-20 DIAGNOSIS — Z471 Aftercare following joint replacement surgery: Secondary | ICD-10-CM | POA: Diagnosis not present

## 2024-04-20 DIAGNOSIS — M1990 Unspecified osteoarthritis, unspecified site: Secondary | ICD-10-CM | POA: Diagnosis not present

## 2024-04-20 DIAGNOSIS — M419 Scoliosis, unspecified: Secondary | ICD-10-CM | POA: Diagnosis not present

## 2024-04-21 DIAGNOSIS — Z471 Aftercare following joint replacement surgery: Secondary | ICD-10-CM | POA: Diagnosis not present

## 2024-04-21 DIAGNOSIS — F419 Anxiety disorder, unspecified: Secondary | ICD-10-CM | POA: Diagnosis not present

## 2024-04-21 DIAGNOSIS — M419 Scoliosis, unspecified: Secondary | ICD-10-CM | POA: Diagnosis not present

## 2024-04-21 DIAGNOSIS — D649 Anemia, unspecified: Secondary | ICD-10-CM | POA: Diagnosis not present

## 2024-04-21 DIAGNOSIS — K219 Gastro-esophageal reflux disease without esophagitis: Secondary | ICD-10-CM | POA: Diagnosis not present

## 2024-04-21 DIAGNOSIS — H5213 Myopia, bilateral: Secondary | ICD-10-CM | POA: Diagnosis not present

## 2024-04-21 DIAGNOSIS — J302 Other seasonal allergic rhinitis: Secondary | ICD-10-CM | POA: Diagnosis not present

## 2024-04-21 DIAGNOSIS — M1990 Unspecified osteoarthritis, unspecified site: Secondary | ICD-10-CM | POA: Diagnosis not present

## 2024-04-21 DIAGNOSIS — K5900 Constipation, unspecified: Secondary | ICD-10-CM | POA: Diagnosis not present

## 2024-04-21 DIAGNOSIS — E559 Vitamin D deficiency, unspecified: Secondary | ICD-10-CM | POA: Diagnosis not present

## 2024-04-24 ENCOUNTER — Encounter: Admitting: Physical Therapy

## 2024-04-25 DIAGNOSIS — M1990 Unspecified osteoarthritis, unspecified site: Secondary | ICD-10-CM | POA: Diagnosis not present

## 2024-04-25 DIAGNOSIS — D649 Anemia, unspecified: Secondary | ICD-10-CM | POA: Diagnosis not present

## 2024-04-25 DIAGNOSIS — F419 Anxiety disorder, unspecified: Secondary | ICD-10-CM | POA: Diagnosis not present

## 2024-04-25 DIAGNOSIS — Z471 Aftercare following joint replacement surgery: Secondary | ICD-10-CM | POA: Diagnosis not present

## 2024-04-25 DIAGNOSIS — H5213 Myopia, bilateral: Secondary | ICD-10-CM | POA: Diagnosis not present

## 2024-04-25 DIAGNOSIS — E559 Vitamin D deficiency, unspecified: Secondary | ICD-10-CM | POA: Diagnosis not present

## 2024-04-25 DIAGNOSIS — K5900 Constipation, unspecified: Secondary | ICD-10-CM | POA: Diagnosis not present

## 2024-04-25 DIAGNOSIS — M419 Scoliosis, unspecified: Secondary | ICD-10-CM | POA: Diagnosis not present

## 2024-04-25 DIAGNOSIS — J302 Other seasonal allergic rhinitis: Secondary | ICD-10-CM | POA: Diagnosis not present

## 2024-04-25 DIAGNOSIS — K219 Gastro-esophageal reflux disease without esophagitis: Secondary | ICD-10-CM | POA: Diagnosis not present

## 2024-04-26 DIAGNOSIS — K219 Gastro-esophageal reflux disease without esophagitis: Secondary | ICD-10-CM | POA: Diagnosis not present

## 2024-04-26 DIAGNOSIS — M419 Scoliosis, unspecified: Secondary | ICD-10-CM | POA: Diagnosis not present

## 2024-04-26 DIAGNOSIS — M1990 Unspecified osteoarthritis, unspecified site: Secondary | ICD-10-CM | POA: Diagnosis not present

## 2024-04-26 DIAGNOSIS — J302 Other seasonal allergic rhinitis: Secondary | ICD-10-CM | POA: Diagnosis not present

## 2024-04-26 DIAGNOSIS — K5900 Constipation, unspecified: Secondary | ICD-10-CM | POA: Diagnosis not present

## 2024-04-26 DIAGNOSIS — F419 Anxiety disorder, unspecified: Secondary | ICD-10-CM | POA: Diagnosis not present

## 2024-04-26 DIAGNOSIS — D649 Anemia, unspecified: Secondary | ICD-10-CM | POA: Diagnosis not present

## 2024-04-26 DIAGNOSIS — Z471 Aftercare following joint replacement surgery: Secondary | ICD-10-CM | POA: Diagnosis not present

## 2024-04-26 DIAGNOSIS — H5213 Myopia, bilateral: Secondary | ICD-10-CM | POA: Diagnosis not present

## 2024-04-26 DIAGNOSIS — E559 Vitamin D deficiency, unspecified: Secondary | ICD-10-CM | POA: Diagnosis not present

## 2024-04-27 ENCOUNTER — Other Ambulatory Visit (HOSPITAL_BASED_OUTPATIENT_CLINIC_OR_DEPARTMENT_OTHER): Payer: Self-pay

## 2024-04-27 ENCOUNTER — Ambulatory Visit: Admitting: Physician Assistant

## 2024-04-27 ENCOUNTER — Encounter: Payer: Self-pay | Admitting: Physician Assistant

## 2024-04-27 DIAGNOSIS — Z96642 Presence of left artificial hip joint: Secondary | ICD-10-CM

## 2024-04-27 NOTE — Progress Notes (Signed)
 HPI: Natalie [name] comes in today status post left total hip arthroplasty 04/14/2024.  She states she is doing very well.  She is almost finished with home PT.  She states she has no real pain.  He is no longer taking any pain medicine only taking Celebrex  and aspirin 81 mg twice daily.  She is on no aspirin prior to surgery.  She has had no fevers chills.  She did have problems with constipation postop.  Review of systems: See HPI otherwise negative  Physical exam: General: Well-developed well-nourished female no acute distress mood and affect appropriate.  Psych alert and orient x 3 Left hip: Surgical incisions well-approximated with staples no signs of wound dehiscence or infection.  Staples harvested Steri-Strips applied.  Left calf supple nontender.  Dorsiflexion plantarflexion right ankle intact.  Fluid motion of the left hip without significant pain.   Impression: Status post left total hip arthroplasty  Plan: She will continue work on range of motion and strengthening left lower extremity.  Scar tissue mobilization encouraged.  Questions were encouraged and answered at length.  Will have her follow-up with us  in 1 month.  Will keep her out of work until November 3 and then she can begin working remotely.  Reevaluate her work status at follow-up.  She will take aspirin once daily for another week and then discontinue.

## 2024-04-28 DIAGNOSIS — J302 Other seasonal allergic rhinitis: Secondary | ICD-10-CM | POA: Diagnosis not present

## 2024-04-28 DIAGNOSIS — F419 Anxiety disorder, unspecified: Secondary | ICD-10-CM | POA: Diagnosis not present

## 2024-04-28 DIAGNOSIS — Z471 Aftercare following joint replacement surgery: Secondary | ICD-10-CM | POA: Diagnosis not present

## 2024-04-28 DIAGNOSIS — M1990 Unspecified osteoarthritis, unspecified site: Secondary | ICD-10-CM | POA: Diagnosis not present

## 2024-04-28 DIAGNOSIS — K219 Gastro-esophageal reflux disease without esophagitis: Secondary | ICD-10-CM | POA: Diagnosis not present

## 2024-04-28 DIAGNOSIS — E559 Vitamin D deficiency, unspecified: Secondary | ICD-10-CM | POA: Diagnosis not present

## 2024-04-28 DIAGNOSIS — H5213 Myopia, bilateral: Secondary | ICD-10-CM | POA: Diagnosis not present

## 2024-04-28 DIAGNOSIS — D649 Anemia, unspecified: Secondary | ICD-10-CM | POA: Diagnosis not present

## 2024-04-28 DIAGNOSIS — M419 Scoliosis, unspecified: Secondary | ICD-10-CM | POA: Diagnosis not present

## 2024-04-28 DIAGNOSIS — K5900 Constipation, unspecified: Secondary | ICD-10-CM | POA: Diagnosis not present

## 2024-05-01 ENCOUNTER — Other Ambulatory Visit: Payer: Self-pay

## 2024-05-01 ENCOUNTER — Other Ambulatory Visit: Payer: Self-pay | Admitting: Orthopaedic Surgery

## 2024-05-01 ENCOUNTER — Encounter (HOSPITAL_BASED_OUTPATIENT_CLINIC_OR_DEPARTMENT_OTHER): Payer: Self-pay

## 2024-05-01 ENCOUNTER — Other Ambulatory Visit (HOSPITAL_BASED_OUTPATIENT_CLINIC_OR_DEPARTMENT_OTHER): Payer: Self-pay

## 2024-05-01 ENCOUNTER — Encounter: Admitting: Physical Therapy

## 2024-05-01 MED ORDER — CELECOXIB 200 MG PO CAPS
200.0000 mg | ORAL_CAPSULE | Freq: Two times a day (BID) | ORAL | 1 refills | Status: AC | PRN
  Filled 2024-05-01: qty 60, 30d supply, fill #0

## 2024-05-01 MED ORDER — ESTRADIOL 0.01 % VA CREA
TOPICAL_CREAM | VAGINAL | 2 refills | Status: AC
  Filled 2024-05-01 (×2): qty 42.5, 90d supply, fill #0
  Filled 2024-05-15: qty 42.5, 30d supply, fill #0

## 2024-05-02 ENCOUNTER — Other Ambulatory Visit (HOSPITAL_BASED_OUTPATIENT_CLINIC_OR_DEPARTMENT_OTHER): Payer: Self-pay

## 2024-05-08 ENCOUNTER — Encounter: Admitting: Physical Therapy

## 2024-05-15 ENCOUNTER — Ambulatory Visit: Attending: Family Medicine | Admitting: Physical Therapy

## 2024-05-15 ENCOUNTER — Other Ambulatory Visit (HOSPITAL_BASED_OUTPATIENT_CLINIC_OR_DEPARTMENT_OTHER): Payer: Self-pay

## 2024-05-15 ENCOUNTER — Encounter: Payer: Self-pay | Admitting: Radiology

## 2024-05-15 DIAGNOSIS — M6281 Muscle weakness (generalized): Secondary | ICD-10-CM | POA: Diagnosis not present

## 2024-05-15 DIAGNOSIS — M25552 Pain in left hip: Secondary | ICD-10-CM | POA: Diagnosis not present
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
## 2024-05-19 ENCOUNTER — Other Ambulatory Visit (HOSPITAL_BASED_OUTPATIENT_CLINIC_OR_DEPARTMENT_OTHER): Payer: Self-pay

## 2024-05-25 ENCOUNTER — Encounter: Payer: Self-pay | Admitting: Radiology

## 2024-05-25 ENCOUNTER — Ambulatory Visit: Admitting: Physician Assistant

## 2024-05-25 ENCOUNTER — Encounter: Payer: Self-pay | Admitting: Physician Assistant

## 2024-05-25 DIAGNOSIS — Z96642 Presence of left artificial hip joint: Secondary | ICD-10-CM

## 2024-05-25 NOTE — Progress Notes (Signed)
 HPI: Patient returns today now almost 6 weeks status post left total hip arthroplasty she is overall doing well.  She has had some thigh soreness numbness.  She has difficulty going up and down stairs.  Denies any fevers chills.  She is only taking Celebrex  twice daily at this point in time for soreness.  Physical exam: Left hip surgical incisions healing well.  Good range of motion of the left hip without pain.  Left calf supple nontender dorsiflexion plantarflexion left ankle intact.  Impression: Status post left total hip arthroplasty 04/14/2024  Plan: Will have her continue partially remote as needed work until December 1 and then she can go back to full duties.  She will work on scar tissue mobilization.  Follow-up with us  at her 55-month postop visit and we will obtain an AP pelvis and a lateral view of her left hip.  She will follow-up with us  sooner if there is any questions concerns.

## 2024-06-20 ENCOUNTER — Ambulatory Visit: Payer: Self-pay | Admitting: Physical Therapy

## 2024-07-21 ENCOUNTER — Encounter: Payer: Commercial Managed Care - PPO | Admitting: Family Medicine

## 2024-10-23 ENCOUNTER — Ambulatory Visit: Admitting: Physician Assistant
